# Patient Record
Sex: Male | Born: 1945
Health system: Southern US, Community
[De-identification: ages and names within clinical notes are randomized; demographics above are authoritative.]

## PROBLEM LIST (undated history)

## (undated) DIAGNOSIS — M549 Dorsalgia, unspecified: Secondary | ICD-10-CM

## (undated) HISTORY — PX: EAR MASTOIDECTOMY W/ COCHLEAR IMPLANT W/ LANDMARK: SHX1483

## (undated) HISTORY — PX: OTHER SURGICAL HISTORY: SHX169

## (undated) HISTORY — PX: BACK SURGERY: SHX140

## (undated) HISTORY — PX: INNER EAR SURGERY: SHX679

## (undated) HISTORY — PX: CATARACT EXTRACTION: SUR2

---

## 1997-12-27 ENCOUNTER — Emergency Department (HOSPITAL_COMMUNITY): Admission: EM | Admit: 1997-12-27 | Discharge: 1997-12-27 | Payer: Self-pay

## 2003-01-22 ENCOUNTER — Other Ambulatory Visit: Admission: RE | Admit: 2003-01-22 | Discharge: 2003-01-22 | Payer: Self-pay | Admitting: Family Medicine

## 2004-12-08 ENCOUNTER — Ambulatory Visit (HOSPITAL_COMMUNITY): Admission: RE | Admit: 2004-12-08 | Discharge: 2004-12-08 | Payer: Self-pay | Admitting: Family Medicine

## 2006-05-09 ENCOUNTER — Ambulatory Visit (HOSPITAL_COMMUNITY): Admission: RE | Admit: 2006-05-09 | Discharge: 2006-05-09 | Payer: Self-pay | Admitting: Family Medicine

## 2006-06-13 ENCOUNTER — Encounter (INDEPENDENT_AMBULATORY_CARE_PROVIDER_SITE_OTHER): Payer: Self-pay | Admitting: *Deleted

## 2006-06-13 ENCOUNTER — Ambulatory Visit (HOSPITAL_COMMUNITY): Admission: RE | Admit: 2006-06-13 | Discharge: 2006-06-13 | Payer: Self-pay | Admitting: General Surgery

## 2007-07-04 ENCOUNTER — Ambulatory Visit (HOSPITAL_COMMUNITY): Admission: RE | Admit: 2007-07-04 | Discharge: 2007-07-04 | Payer: Self-pay | Admitting: Family Medicine

## 2008-01-31 ENCOUNTER — Ambulatory Visit (HOSPITAL_COMMUNITY): Admission: RE | Admit: 2008-01-31 | Discharge: 2008-01-31 | Payer: Self-pay | Admitting: Family Medicine

## 2008-04-15 ENCOUNTER — Inpatient Hospital Stay (HOSPITAL_COMMUNITY): Admission: RE | Admit: 2008-04-15 | Discharge: 2008-04-18 | Payer: Self-pay | Admitting: Neurosurgery

## 2009-04-09 ENCOUNTER — Ambulatory Visit (HOSPITAL_COMMUNITY): Admission: RE | Admit: 2009-04-09 | Discharge: 2009-04-09 | Payer: Self-pay | Admitting: Family Medicine

## 2009-05-05 ENCOUNTER — Inpatient Hospital Stay (HOSPITAL_COMMUNITY): Admission: RE | Admit: 2009-05-05 | Discharge: 2009-05-07 | Payer: Self-pay | Admitting: Neurosurgery

## 2010-06-16 LAB — URINALYSIS, ROUTINE W REFLEX MICROSCOPIC
Bilirubin Urine: NEGATIVE
Glucose, UA: NEGATIVE mg/dL
Hgb urine dipstick: NEGATIVE
Ketones, ur: NEGATIVE mg/dL
Protein, ur: NEGATIVE mg/dL
Urobilinogen, UA: 0.2 mg/dL (ref 0.0–1.0)
pH: 5.5 (ref 5.0–8.0)

## 2010-06-16 LAB — CBC
Hemoglobin: 17.3 g/dL — ABNORMAL HIGH (ref 13.0–17.0)
MCHC: 34.8 g/dL (ref 30.0–36.0)
MCHC: 35.4 g/dL (ref 30.0–36.0)
MCV: 94.8 fL (ref 78.0–100.0)
MCV: 96.2 fL (ref 78.0–100.0)
WBC: 12.8 10*3/uL — ABNORMAL HIGH (ref 4.0–10.5)

## 2010-06-16 LAB — COMPREHENSIVE METABOLIC PANEL
AST: 25 U/L (ref 0–37)
Albumin: 4 g/dL (ref 3.5–5.2)
Alkaline Phosphatase: 77 U/L (ref 39–117)
CO2: 29 mEq/L (ref 19–32)
Calcium: 9.6 mg/dL (ref 8.4–10.5)
Chloride: 101 mEq/L (ref 96–112)
Glucose, Bld: 235 mg/dL — ABNORMAL HIGH (ref 70–99)
Potassium: 4.4 mEq/L (ref 3.5–5.1)
Sodium: 139 mEq/L (ref 135–145)
Total Protein: 7.1 g/dL (ref 6.0–8.3)

## 2010-06-16 LAB — DIFFERENTIAL
Basophils Relative: 1 % (ref 0–1)
Lymphocytes Relative: 19 % (ref 12–46)
Monocytes Relative: 5 % (ref 3–12)
Neutrophils Relative %: 74 % (ref 43–77)

## 2010-06-16 LAB — ABO/RH: ABO/RH(D): O NEG

## 2010-06-16 LAB — PROTIME-INR: INR: 1.06 (ref 0.00–1.49)

## 2010-06-16 LAB — TYPE AND SCREEN
ABO/RH(D): O NEG
Antibody Screen: NEGATIVE

## 2010-06-16 LAB — APTT: aPTT: 27 seconds (ref 24–37)

## 2010-06-16 LAB — MRSA PCR SCREENING: MRSA by PCR: NEGATIVE

## 2010-07-12 LAB — COMPREHENSIVE METABOLIC PANEL
ALT: 31 U/L (ref 0–53)
AST: 27 U/L (ref 0–37)
Alkaline Phosphatase: 78 U/L (ref 39–117)
BUN: 9 mg/dL (ref 6–23)
CO2: 28 mEq/L (ref 19–32)
Calcium: 9.4 mg/dL (ref 8.4–10.5)
Chloride: 104 mEq/L (ref 96–112)
GFR calc non Af Amer: >60 mL/min (ref >60)
Potassium: 4 mEq/L (ref 3.5–5.1)
Sodium: 139 mEq/L (ref 135–145)
Total Bilirubin: 0.5 mg/dL (ref 0.3–1.2)

## 2010-07-12 LAB — DIFFERENTIAL
Basophils Relative: 1 % (ref 0–1)
Eosinophils Absolute: 0.5 10*3/uL (ref 0.0–0.7)
Eosinophils Relative: 4 % (ref 0–5)
Lymphs Abs: 3.1 10*3/uL (ref 0.7–4.0)
Monocytes Absolute: 0.9 10*3/uL (ref 0.1–1.0)
Monocytes Relative: 8 % (ref 3–12)
Neutrophils Relative %: 60 % (ref 43–77)

## 2010-07-12 LAB — PROTIME-INR
INR: 1 (ref 0.00–1.49)
Prothrombin Time: 13.7 seconds (ref 11.6–15.2)

## 2010-07-12 LAB — CBC
HCT: 48.4 % (ref 39.0–52.0)
MCHC: 33.7 g/dL (ref 30.0–36.0)
MCV: 93.3 fL (ref 78.0–100.0)
RDW: 13 % (ref 11.5–15.5)
WBC: 11.3 10*3/uL — ABNORMAL HIGH (ref 4.0–10.5)

## 2010-07-12 LAB — URINALYSIS, ROUTINE W REFLEX MICROSCOPIC
Glucose, UA: NEGATIVE mg/dL
Nitrite: NEGATIVE
Protein, ur: NEGATIVE mg/dL
Urobilinogen, UA: 0.2 mg/dL (ref 0.0–1.0)

## 2010-08-10 NOTE — Discharge Summary (Signed)
Douglas Waller, Douglas Waller                 ACCOUNT NO.:  [id number]   MEDICAL RECORD NO.:  [id number]          PATIENT TYPE:  INP   LOCATION:  3013                         FACILITY:  [facility]   PHYSICIAN:  Payton Doughty, M.D.      DATE OF BIRTH:  06/09/1945   DATE OF ADMISSION:  04/15/2008  DATE OF DISCHARGE:  04/18/2008                               DISCHARGE SUMMARY   ADMITTING DIAGNOSIS:  Spondylosis at L2-L3, L3-L4, and L4-5, neurogenic  claudication.   DISCHARGE DIAGNOSIS:  Spondylosis at L2-L3, L3-L4, and L4-5, neurogenic  claudication.   OPERATIVE PROCEDURES:  L2-L3, L3-L4, and L4-L5 decompressive lumbar  laminectomy.   DICTATING DOCTOR:  Payton Doughty, MD.   COMPLICATIONS:  None.   DISCHARGE STATUS:  Alive and well.   BODY OF TEXT:  This is a 65 year old right-handed white gentleman.  History and physical is recounted in the chart.  He has had back pain  for numerous years.  He has developed claudicatory symptoms in the lower  extremities.  MRI showed spinal stenosis.  He was admitted for  laminectomy.   MEDICAL HISTORY:  Benign.   General exam is intact.   ALLERGIES:  PENICILLIN and CODEINE.   SURGICAL HISTORY:  Left hip prosthesis and cataract.   Neurologically, he was intact.  No claudicatory complaints.  He was  admitted after ascertaining normal drug values and underwent a  decompressive laminectomy.  Postoperatively, he has done well.  For a  couple of days, he was on PCA and had a Foley.  Foley was removed.  He  is up and about, eating.  He had trouble with his bowels, but now it is  working fine.  He has been discharged home in the care of his family.  His followup to be in the [facility] offices in about a week for sutures.           ______________________________  Payton Doughty, M.D.     MWR/MEDQ  D:  04/18/2008  T:  04/19/2008  Job:  [id number]

## 2010-08-10 NOTE — H&P (Signed)
NAMEELIZANDRO, LAURA                 ACCOUNT NO.:  [id number]   MEDICAL RECORD NO.:  [id number]          PATIENT TYPE:  INP   LOCATION:  3013                         FACILITY:  [facility]   PHYSICIAN:  Payton Doughty, M.D.      DATE OF BIRTH:  [birth year]   DATE OF ADMISSION:  04/15/2008  DATE OF DISCHARGE:                              HISTORY & PHYSICAL   BODY:  A 65 year old right-handed white gentleman had pain in his back  for lot of years.  He has been developing claudicatory complaints upper  buttocks and lower back.  MRI showed spinal stenosis.  He is now  admitted for laminectomy.   MEDICAL HISTORY:  Benign.  He has lost 50 pounds and has not had any  diabetes, takes Lipitor, aspirin, fish oil and oxycodone.   ALLERGIES:  He is allergic to PENICILLIN and CODEINE.   SURGICAL HISTORY:  Left hip prosthesis and a cataract operation.   SOCIAL HISTORY:  He smokes a pack of cigarettes a day.  Does not drink  alcohol.  He is a Visual merchandiser.   FAMILY HISTORY:  Parents are deceased, history is not given.   REVIEW OF SYSTEMS:  Remarkable for painful urination, difficulty  starting and stopping, leg weakness, and back pain.   PHYSICAL EXAMINATION:  HEENT:  Normal limits.  He has reasonable range  of motion of neck.  CHEST:  Clear.  CARDIAC:  Regular rate and rhythm.  ABDOMEN:  Nontender.  No hepatosplenomegaly.  EXTREMITIES:  Without clubbing or cyanosis.  Peripheral pulses are good.  GU:  Deferred.  NEUROLOGIC:  He is awake, alert, and oriented.  Cranial nerves are  intact.  Motor exam shows 5/5 strength throughout the upper and lower  extremities.  Sensory dysesthesias described in the left in an L4-L5  distribution.  He reflects it in lower extremities.   LABORATORY DATA:  MRI shows severe spinal stenosis at 2-3, 3-4 and 4-5.  5-1 then looks so bad.   CLINICAL IMPRESSION:  Neurogenic claudication secondary to spinal  stenosis.  Plan is to decompress with laminectomy 2-3, 3-4, and 4-5.  The risks and benefits have been discussed with him.  He wished to  proceed.           ______________________________  Payton Doughty, M.D.    MWR/MEDQ  D:  04/15/2008  T:  04/16/2008  Job:  [id number]

## 2010-08-10 NOTE — Op Note (Signed)
Douglas Waller, Douglas Waller                 ACCOUNT NO.:  [id number]   MEDICAL RECORD NO.:  [id number]          PATIENT TYPE:  INP   LOCATION:  3013                         FACILITY:  [facility]   PHYSICIAN:  Payton Doughty, M.D.      DATE OF BIRTH:  1945/11/22   DATE OF PROCEDURE:  04/15/2008  DATE OF DISCHARGE:                               OPERATIVE REPORT   PREOPERATIVE DIAGNOSIS:  Neurogenic claudication secondary to spinal  stenosis at L2-3, L3-4, and L4-5.   POSTOPERATIVE DIAGNOSIS:  Neurogenic claudication secondary to spinal  stenosis at L2-3, L3-4, and L4-5.   OPERATIVE PROCEDURE:  L2-3, L3-4, and L4-5 decompressive lumbar  laminectomy.   ANESTHESIA:  General endotracheal.   PREPARATION:  Prepped and draped with alcohol wipe.   COMPLICATIONS:  None.   NURSING ASSISTANT:  Bedelia Person, M.D.   DOCTOR ASSISTANT:  Cristi Loron, M.D.   BODY OF TEXT:  This 65 year old with severe neurogenic claudication,  taken to the operating room, smoothly anesthetized and intubated, placed  prone on the operating table.  Following shave, prep, and drape in the  usual sterile fashion, the skin was infiltrated with 1% lidocaine with  1:400,000 epinephrine.  The skin was incised from the top of L2 to the  bottom of L4.  The lamina of L2, L3, and L4 were dissected free in the  subperiosteal plane and intraoperative x-ray confirmed correctness of  level.  Having correctness of level, a total laminectomy of L2, L3, and  L4 was carried out using the large Leksell, small Leksell, and high-  speed drill and the Kerrison.  This allowed complete decompression and  removing the ligamentum flavum, it was tightest at L3-4.  The abundant,  redundant ligamentum flavum was removed and the foramen carefully  inspected and found to be open.  Wound was irrigated and hemostasis  assured.  Fat was placed in the laminectomy defect.  Successive layers  of 0 Vicryl, 2-0 Vicryl, and 3-0 nylon were used to close.  Betadine  and  Telfa dressing was applied and made occlusive with OpSite and the  patient returned to the recovery room in good condition.           ______________________________  Payton Doughty, M.D.     MWR/MEDQ  D:  04/15/2008  T:  04/16/2008  Job:  [id number]

## 2010-08-13 NOTE — H&P (Signed)
Douglas Waller, Douglas Waller                 ACCOUNT NO.:  [id number]   MEDICAL RECORD NO.:  [id number]          PATIENT TYPE:  AMB   LOCATION:                                FACILITY:  [facility]   PHYSICIAN:  Dalia Heading, M.D.  DATE OF BIRTH:  [birth year]   DATE OF ADMISSION:  06/13/2006  DATE OF DISCHARGE:  LH                              HISTORY & PHYSICAL   CHIEF COMPLAINT:  Need for screening colonoscopy.   HISTORY OF PRESENT ILLNESS:  The patient is a 65 year old white male who  is referred for endoscopic evaluation.  He needs colonoscopy for  screening purposes.  No abdominal pain, weight loss, nausea, vomiting,  diarrhea, constipation, melena, or hematochezia have been noted.  He has  never had a colonoscopy.  There is no family history of colon carcinoma.   PAST MEDICAL HISTORY:  Includes high cholesterol levels.   PAST SURGICAL HISTORY:  Unremarkable.   CURRENT MEDICATIONS:  Lipitor.   ALLERGIES:  No known drug allergies.   REVIEW OF SYSTEMS:  The patient smokes a pack of cigarettes a day.  Denies alcohol use.  He denies any other cardiopulmonary difficulties or  bleeding disorders.   PHYSICAL EXAMINATION:  GENERAL:  The patient is a well-developed, well-  nourished white male in no acute distress.  LUNGS:  Clear to auscultation. with good breath sounds bilaterally.  HEART:  Reveals a regular rate and rhythm, without S3, S4, or murmurs.  ABDOMEN:  Soft, nontender, nondistended.  No hepatosplenomegaly or  masses are noted.  RECTAL:  Deferred to the procedure.   IMPRESSION:  Need for screening colonoscopy.   PLAN:  The patient was scheduled for colonoscopy on June 13, 2006.  The  risks and benefits of the procedure, including bleeding and perforation,  were fully explained to the patient, who gave informed consent.      Dalia Heading, M.D.  Electronically Signed     MAJ/MEDQ  D:  05/30/2006  T:  05/30/2006  Job:  [id number]   cc:   Patrica Duel, M.D.  Fax:  [phone]

## 2011-04-04 DIAGNOSIS — Z6825 Body mass index (BMI) 25.0-25.9, adult: Secondary | ICD-10-CM | POA: Diagnosis not present

## 2011-04-04 DIAGNOSIS — G589 Mononeuropathy, unspecified: Secondary | ICD-10-CM | POA: Diagnosis not present

## 2011-04-04 DIAGNOSIS — E119 Type 2 diabetes mellitus without complications: Secondary | ICD-10-CM | POA: Diagnosis not present

## 2011-04-04 DIAGNOSIS — G8929 Other chronic pain: Secondary | ICD-10-CM | POA: Diagnosis not present

## 2011-04-26 DIAGNOSIS — Z0000 Encounter for general adult medical examination without abnormal findings: Secondary | ICD-10-CM | POA: Diagnosis not present

## 2011-04-26 DIAGNOSIS — Z125 Encounter for screening for malignant neoplasm of prostate: Secondary | ICD-10-CM | POA: Diagnosis not present

## 2011-04-26 DIAGNOSIS — E785 Hyperlipidemia, unspecified: Secondary | ICD-10-CM | POA: Diagnosis not present

## 2011-04-26 DIAGNOSIS — M549 Dorsalgia, unspecified: Secondary | ICD-10-CM | POA: Diagnosis not present

## 2011-04-26 DIAGNOSIS — Z6825 Body mass index (BMI) 25.0-25.9, adult: Secondary | ICD-10-CM | POA: Diagnosis not present

## 2011-04-26 DIAGNOSIS — E119 Type 2 diabetes mellitus without complications: Secondary | ICD-10-CM | POA: Diagnosis not present

## 2011-07-15 DIAGNOSIS — E119 Type 2 diabetes mellitus without complications: Secondary | ICD-10-CM | POA: Diagnosis not present

## 2011-07-15 DIAGNOSIS — G8929 Other chronic pain: Secondary | ICD-10-CM | POA: Diagnosis not present

## 2011-07-15 DIAGNOSIS — IMO0002 Reserved for concepts with insufficient information to code with codable children: Secondary | ICD-10-CM | POA: Diagnosis not present

## 2011-10-26 DIAGNOSIS — H0019 Chalazion unspecified eye, unspecified eyelid: Secondary | ICD-10-CM | POA: Diagnosis not present

## 2011-10-31 DIAGNOSIS — G8929 Other chronic pain: Secondary | ICD-10-CM | POA: Diagnosis not present

## 2011-10-31 DIAGNOSIS — Z79899 Other long term (current) drug therapy: Secondary | ICD-10-CM | POA: Diagnosis not present

## 2011-11-04 DIAGNOSIS — M545 Low back pain: Secondary | ICD-10-CM | POA: Diagnosis not present

## 2011-11-18 DIAGNOSIS — Z6825 Body mass index (BMI) 25.0-25.9, adult: Secondary | ICD-10-CM | POA: Diagnosis not present

## 2011-11-18 DIAGNOSIS — G8929 Other chronic pain: Secondary | ICD-10-CM | POA: Diagnosis not present

## 2011-12-07 DIAGNOSIS — K219 Gastro-esophageal reflux disease without esophagitis: Secondary | ICD-10-CM | POA: Diagnosis not present

## 2011-12-07 DIAGNOSIS — M549 Dorsalgia, unspecified: Secondary | ICD-10-CM | POA: Diagnosis not present

## 2011-12-07 DIAGNOSIS — Z23 Encounter for immunization: Secondary | ICD-10-CM | POA: Diagnosis not present

## 2011-12-07 DIAGNOSIS — Z6825 Body mass index (BMI) 25.0-25.9, adult: Secondary | ICD-10-CM | POA: Diagnosis not present

## 2011-12-15 ENCOUNTER — Emergency Department (HOSPITAL_COMMUNITY): Payer: Medicare Other

## 2011-12-15 ENCOUNTER — Emergency Department (HOSPITAL_COMMUNITY)
Admission: EM | Admit: 2011-12-15 | Discharge: 2011-12-15 | Disposition: A | Discharge status: Home / Self Care | Payer: Medicare Other | Attending: Emergency Medicine | Admitting: Emergency Medicine

## 2011-12-15 ENCOUNTER — Encounter (HOSPITAL_COMMUNITY): Payer: Self-pay | Admitting: *Deleted

## 2011-12-15 DIAGNOSIS — W268XXA Contact with other sharp object(s), not elsewhere classified, initial encounter: Secondary | ICD-10-CM | POA: Insufficient documentation

## 2011-12-15 DIAGNOSIS — F17200 Nicotine dependence, unspecified, uncomplicated: Secondary | ICD-10-CM | POA: Insufficient documentation

## 2011-12-15 DIAGNOSIS — M25549 Pain in joints of unspecified hand: Secondary | ICD-10-CM | POA: Diagnosis not present

## 2011-12-15 DIAGNOSIS — S61209A Unspecified open wound of unspecified finger without damage to nail, initial encounter: Secondary | ICD-10-CM | POA: Diagnosis not present

## 2011-12-15 DIAGNOSIS — IMO0002 Reserved for concepts with insufficient information to code with codable children: Secondary | ICD-10-CM | POA: Insufficient documentation

## 2011-12-15 DIAGNOSIS — S68118A Complete traumatic metacarpophalangeal amputation of other finger, initial encounter: Secondary | ICD-10-CM | POA: Diagnosis not present

## 2011-12-15 LAB — CBC WITH DIFFERENTIAL/PLATELET
Basophils Absolute: 0.1 10*3/uL (ref 0.0–0.1)
HCT: 47.2 % (ref 39.0–52.0)
Lymphocytes Relative: 27 % (ref 12–46)
Neutro Abs: 5.1 10*3/uL (ref 1.7–7.7)
Neutrophils Relative %: 61 % (ref 43–77)
Platelets: 187 10*3/uL (ref 150–400)
RDW: 13 % (ref 11.5–15.5)
WBC: 8.4 10*3/uL (ref 4.0–10.5)

## 2011-12-15 LAB — HEPATIC FUNCTION PANEL
ALT: 14 U/L (ref 0–53)
Albumin: 3.6 g/dL (ref 3.5–5.2)
Alkaline Phosphatase: 107 U/L (ref 39–117)
Total Protein: 7.7 g/dL (ref 6.0–8.3)

## 2011-12-15 LAB — BASIC METABOLIC PANEL
CO2: 29 mEq/L (ref 19–32)
Chloride: 96 mEq/L (ref 96–112)
GFR calc Af Amer: >90 mL/min (ref >90)
Potassium: 4.1 mEq/L (ref 3.5–5.1)
Sodium: 134 mEq/L — ABNORMAL LOW (ref 135–145)

## 2011-12-15 MED ORDER — LIDOCAINE HCL (PF) 1 % IJ SOLN
INTRAMUSCULAR | Status: AC
  Administered 2011-12-15: 10 mL
  Filled 2011-12-15: qty 20

## 2011-12-15 MED ORDER — OXYCODONE-ACETAMINOPHEN 5-325 MG PO TABS: 1.0000 | ORAL_TABLET | Freq: Four times a day (QID) | ORAL | Status: DC | PRN

## 2011-12-15 MED ORDER — CEPHALEXIN 500 MG PO CAPS: 500.0000 mg | ORAL_CAPSULE | Freq: Four times a day (QID) | ORAL | Status: DC

## 2011-12-15 MED ORDER — TETANUS-DIPHTH-ACELL PERTUSSIS 5-2.5-18.5 LF-MCG/0.5 IM SUSP
0.5000 mL | Freq: Once | INTRAMUSCULAR | Status: AC
  Administered 2011-12-15: 0.5 mL via INTRAMUSCULAR
  Filled 2011-12-15: qty 0.5

## 2011-12-15 MED ORDER — LIDOCAINE HCL (PF) 1 % IJ SOLN
INTRAMUSCULAR | Status: AC
  Filled 2011-12-15: qty 10

## 2011-12-15 MED ORDER — CEFAZOLIN SODIUM 1-5 GM-% IV SOLN
1.0000 g | Freq: Once | INTRAVENOUS | Status: AC
  Administered 2011-12-15: 1 g via INTRAVENOUS
  Filled 2011-12-15: qty 50

## 2011-12-15 NOTE — ED Notes (Addendum)
Lac to rt 5th finger , caught in a belt on motor at [employer] [place]  .  Tip of finger and pad avulsed .  Injury to ring  Finger also

## 2011-12-15 NOTE — ED Provider Notes (Signed)
History   This chart was scribed for Douglas B. Bernette Mayers, MD by Sofie Rower. The patient was seen in room APA06/APA06 and the patient's care was started at 2:28PM    CSN: [id number]  Arrival date & time 12/15/11  1406   First MD Initiated Contact with Patient 12/15/11 1428      Chief Complaint  Patient presents with  . Laceration    (Consider location/radiation/quality/duration/timing/severity/associated sxs/prior treatment) Patient is a 66 y.o. male presenting with skin laceration. The history is provided by the patient. No language interpreter was used.  Laceration     Douglas Waller is a 66 y.o. male , with a hx of back pain, who presents to the Emergency Department complaining of sudden, moderate, lcaeraton, located at the left 4th and 5th fingers on the right hand, onset today. The pt reports he placed his hand between the motor of his automobile and the fan belt, where he suddenly caught his hand on the fan belt, and cut his fingers. The pt is unaware of when his last tetanus shot was administered. Modifying factors include application of a cotton bandage which provides moderate pain relief of the laceration. The pt has a hx of back surgery, cataract extraction, and inner ear sugery.   The pt denies allergies to any medications.   The pt is a current everyday smoker, however, he does not drink alcohol.   PCP is Dr. Regino Schultze.    History reviewed. No pertinent past medical history.  Past Surgical History  Procedure Date  . Back surgery   . Cataract extraction   . Inner ear surgery     History reviewed. No pertinent family history.  History  Substance Use Topics  . Smoking status: Current Every Day Smoker  . Smokeless tobacco: Not on file  . Alcohol Use: No      Review of Systems  All other systems reviewed and are negative.    Allergies  Review of patient's allergies indicates no known allergies.  Home Medications  No current outpatient prescriptions on  file.  BP 136/58  Pulse 65  Temp 98.1 F (36.7 C) (Oral)  Resp 20  Ht 5\' 11"  (1.803 m)  Wt 192 lb (87.091 kg)  BMI 26.78 kg/m2  SpO2 98%  Physical Exam  Nursing note and vitals reviewed. Constitutional: He is oriented to person, place, and time. He appears well-developed and well-nourished.  HENT:  Head: Normocephalic and atraumatic.  Eyes: EOM are normal. Pupils are equal, round, and reactive to light.  Neck: Normal range of motion. Neck supple.  Cardiovascular: Normal rate, normal heart sounds and intact distal pulses.   Pulmonary/Chest: Effort normal and breath sounds normal.  Abdominal: Bowel sounds are normal. He exhibits no distension. There is no tenderness.  Musculoskeletal: Normal range of motion. He exhibits no edema and no tenderness.       Right 5th finger: avulsion/amputation of the pad, extending about half way through the nailbed. Right 4th finger: superficial skin tear proximal to the nailbed.   Neurological: He is alert and oriented to person, place, and time. He has normal strength. No cranial nerve deficit or sensory deficit.  Skin: Skin is warm and dry. No rash noted.  Psychiatric: He has a normal mood and affect.    ED Course  Procedures (including critical care time)  Digital Block Performed by: SHELDON,Douglas B. Consent: Verbal consent obtained.  Anesthesia: Digital block Local anesthetic: lidocaine 1% no epinephrine Anesthetic total: 4 ml Irrigation method: syringe Amount of  cleaning: betadine soaks Patient tolerance: Patient tolerated the procedure well with no immediate complications.   DIAGNOSTIC STUDIES: Oxygen Saturation is 98% on room air, normal by my interpretation.    COORDINATION OF CARE:    2:46PM- Treatment plan discussed with patient. Pt agrees with treatment.   2:47PM- Digital block performed  3:17PM- Recheck. X-ray results and treatment plan discussed with pt. Pt agrees to treatment.   Labs Reviewed - No data to  display Dg Hand Complete Right  12/15/2011  *RADIOLOGY REPORT*  Clinical Data: Laceration to the fourth and fifth digits.  Fifth digit amputation.  RIGHT HAND - COMPLETE 3+ VIEW  Comparison:  Findings: There is amputation of the distal aspect of the distal phalanx in the fifth digit.  A soft tissue amputation is present as well.  The joints are located.  There is irregularity in the tuft of the fourth digit.  Soft tissue swelling is present proximal to the nail bed.  There appears to be disruption of the nail bed as well.  No radiopaque foreign body is present.  The remainder of the hand is remarkable for degenerative change.  IMPRESSION:  1.  Amputation of the distal aspect the distal phalanx and soft tissues in the small finger. 2.  Irregularity of the tuft of the distal phalanx in the ring finger may represent a nondisplaced fracture.  This could be related to remote trauma. 3.  No radiopaque foreign body remains.   Original Report Authenticated By: Jamesetta Orleans. MATTERN, M.D.       No diagnosis found.    MDM  Discussed with Dr. Hilda Lias who will come into the ED to evaluate the patient. Ancef and labs ordered for possible OR revision      I personally performed the services described in the documentation, which were scribed in my presence. The recorded information has been reviewed and considered.    Douglas B. Bernette Mayers, MD 12/15/11 1527

## 2011-12-15 NOTE — Consult Note (Signed)
Reason for Consult:  Finger tip amputation Referring Physician: ER  Douglas Waller is an 65 y.o. male.  HPI: He was using a belt and cut the right hand little and ring fingers with amputation of the distal little finger.  He does not remember when he last had tetanus shot.  He has no other injury.  History reviewed. No pertinent past medical history.  Past Surgical History  Procedure Date  . Back surgery   . Cataract extraction   . Inner ear surgery     History reviewed. No pertinent family history.  Social History:  reports that he has been smoking.  He does not have any smokeless tobacco history on file. He reports that he does not drink alcohol or use illicit drugs.  Allergies:  Allergies  Allergen Reactions  . Codeine     Years ago, patient cannot remember reaction    Medications: I have reviewed the patient's current medications.  No results found for this or any previous visit (from the past 48 hour(s)).  Dg Hand Complete Right  12/15/2011  *RADIOLOGY REPORT*  Clinical Data: Laceration to the fourth and fifth digits.  Fifth digit amputation.  RIGHT HAND - COMPLETE 3+ VIEW  Comparison:  Findings: There is amputation of the distal aspect of the distal phalanx in the fifth digit.  A soft tissue amputation is present as well.  The joints are located.  There is irregularity in the tuft of the fourth digit.  Soft tissue swelling is present proximal to the nail bed.  There appears to be disruption of the nail bed as well.  No radiopaque foreign body is present.  The remainder of the hand is remarkable for degenerative change.  IMPRESSION:  1.  Amputation of the distal aspect the distal phalanx and soft tissues in the small finger. 2.  Irregularity of the tuft of the distal phalanx in the ring finger may represent a nondisplaced fracture.  This could be related to remote trauma. 3.  No radiopaque foreign body remains.   Original Report Authenticated By: Jamesetta Orleans. MATTERN, M.D.      Review of Systems  Musculoskeletal: Positive for joint pain (He cut the right dominant little finger and the right ring finger on a belt today.  He has partial amputation of the little finger radially.).  All other systems reviewed and are negative.   Blood pressure 136/58, pulse 65, temperature 98.1 F (36.7 C), temperature source Oral, resp. rate 20, height 5\' 11"  (1.803 m), weight 87.091 kg (192 lb), SpO2 98.00%. Physical Exam  Constitutional: He is oriented to person, place, and time. He appears well-developed and well-nourished.  HENT:  Head: Normocephalic.  Eyes: Conjunctivae normal and EOM are normal. Pupils are equal, round, and reactive to light.  Neck: Normal range of motion.  Cardiovascular: Normal rate, regular rhythm and intact distal pulses.   Respiratory: Effort normal and breath sounds normal.  GI: Soft. Bowel sounds are normal.  Musculoskeletal: He exhibits tenderness (Partial amputation of the distal phalanx of the little finger radially, oblique cut with bleeding. He has laceration of the tip of the ring finger on the right as well.).       Hands: Neurological: He is alert and oriented to person, place, and time. He has normal reflexes.  Skin: Skin is warm and dry.  Psychiatric: He has a normal mood and affect. His behavior is normal. Judgment and thought content normal.    Assessment/Plan: Amputation of the tip of the little finger  right dominant hand and laceration of the ring finger.  Douglas Waller 12/15/2011, 3:43 PM

## 2011-12-15 NOTE — Progress Notes (Signed)
I prepped and draped his right hand.  He had 1% Xylocaine block.  The finger tip of the right hand was cleansed.  He has coverage of the remaining distal tip of the distal phalanx.  I applied a sterile dressing and finger gauze.  I plan to see him in my office on Monday for dressing change and Scarlet Red dressing.  He is to return if any problem.  He will have a deformity of the finger but he can still flex the tip.  He is to return here if any problem.

## 2011-12-19 DIAGNOSIS — S61209A Unspecified open wound of unspecified finger without damage to nail, initial encounter: Secondary | ICD-10-CM | POA: Diagnosis not present

## 2011-12-22 DIAGNOSIS — S61209A Unspecified open wound of unspecified finger without damage to nail, initial encounter: Secondary | ICD-10-CM | POA: Diagnosis not present

## 2011-12-26 DIAGNOSIS — H26499 Other secondary cataract, unspecified eye: Secondary | ICD-10-CM | POA: Diagnosis not present

## 2012-03-26 DIAGNOSIS — G4700 Insomnia, unspecified: Secondary | ICD-10-CM | POA: Diagnosis not present

## 2012-03-26 DIAGNOSIS — E1129 Type 2 diabetes mellitus with other diabetic kidney complication: Secondary | ICD-10-CM | POA: Diagnosis not present

## 2012-03-26 DIAGNOSIS — R809 Proteinuria, unspecified: Secondary | ICD-10-CM | POA: Diagnosis not present

## 2012-03-26 DIAGNOSIS — Z6825 Body mass index (BMI) 25.0-25.9, adult: Secondary | ICD-10-CM | POA: Diagnosis not present

## 2012-06-14 DIAGNOSIS — H6980 Other specified disorders of Eustachian tube, unspecified ear: Secondary | ICD-10-CM | POA: Diagnosis not present

## 2012-06-14 DIAGNOSIS — H908 Mixed conductive and sensorineural hearing loss, unspecified: Secondary | ICD-10-CM | POA: Diagnosis not present

## 2012-06-14 DIAGNOSIS — H905 Unspecified sensorineural hearing loss: Secondary | ICD-10-CM | POA: Diagnosis not present

## 2012-06-15 ENCOUNTER — Other Ambulatory Visit (HOSPITAL_COMMUNITY): Payer: Self-pay | Admitting: Family Medicine

## 2012-06-15 DIAGNOSIS — Z6826 Body mass index (BMI) 26.0-26.9, adult: Secondary | ICD-10-CM | POA: Diagnosis not present

## 2012-06-15 DIAGNOSIS — K219 Gastro-esophageal reflux disease without esophagitis: Secondary | ICD-10-CM | POA: Diagnosis not present

## 2012-06-15 DIAGNOSIS — E1129 Type 2 diabetes mellitus with other diabetic kidney complication: Secondary | ICD-10-CM | POA: Diagnosis not present

## 2012-06-15 DIAGNOSIS — G8929 Other chronic pain: Secondary | ICD-10-CM | POA: Diagnosis not present

## 2012-06-15 DIAGNOSIS — Z139 Encounter for screening, unspecified: Secondary | ICD-10-CM

## 2012-06-18 ENCOUNTER — Ambulatory Visit (HOSPITAL_COMMUNITY)
Admission: RE | Admit: 2012-06-18 | Discharge: 2012-06-18 | Disposition: A | Discharge status: Home / Self Care | Payer: Medicare Other | Source: Ambulatory Visit | Attending: Family Medicine | Admitting: Family Medicine

## 2012-06-18 DIAGNOSIS — Z1389 Encounter for screening for other disorder: Secondary | ICD-10-CM | POA: Diagnosis not present

## 2012-06-18 DIAGNOSIS — Z139 Encounter for screening, unspecified: Secondary | ICD-10-CM

## 2012-06-18 DIAGNOSIS — Z136 Encounter for screening for cardiovascular disorders: Secondary | ICD-10-CM | POA: Diagnosis not present

## 2012-07-05 DIAGNOSIS — H6980 Other specified disorders of Eustachian tube, unspecified ear: Secondary | ICD-10-CM | POA: Diagnosis not present

## 2012-07-05 DIAGNOSIS — H908 Mixed conductive and sensorineural hearing loss, unspecified: Secondary | ICD-10-CM | POA: Diagnosis not present

## 2012-07-05 DIAGNOSIS — H905 Unspecified sensorineural hearing loss: Secondary | ICD-10-CM | POA: Diagnosis not present

## 2012-07-25 DIAGNOSIS — E1129 Type 2 diabetes mellitus with other diabetic kidney complication: Secondary | ICD-10-CM | POA: Diagnosis not present

## 2012-07-25 DIAGNOSIS — Z23 Encounter for immunization: Secondary | ICD-10-CM | POA: Diagnosis not present

## 2012-07-25 DIAGNOSIS — G4700 Insomnia, unspecified: Secondary | ICD-10-CM | POA: Diagnosis not present

## 2012-07-25 DIAGNOSIS — E785 Hyperlipidemia, unspecified: Secondary | ICD-10-CM | POA: Diagnosis not present

## 2012-07-25 DIAGNOSIS — Z125 Encounter for screening for malignant neoplasm of prostate: Secondary | ICD-10-CM | POA: Diagnosis not present

## 2012-07-25 DIAGNOSIS — IMO0002 Reserved for concepts with insufficient information to code with codable children: Secondary | ICD-10-CM | POA: Diagnosis not present

## 2012-07-25 DIAGNOSIS — G8929 Other chronic pain: Secondary | ICD-10-CM | POA: Diagnosis not present

## 2012-08-24 DIAGNOSIS — Z23 Encounter for immunization: Secondary | ICD-10-CM | POA: Diagnosis not present

## 2012-12-07 DIAGNOSIS — Z6825 Body mass index (BMI) 25.0-25.9, adult: Secondary | ICD-10-CM | POA: Diagnosis not present

## 2012-12-07 DIAGNOSIS — N390 Urinary tract infection, site not specified: Secondary | ICD-10-CM | POA: Diagnosis not present

## 2013-01-17 DIAGNOSIS — H26499 Other secondary cataract, unspecified eye: Secondary | ICD-10-CM | POA: Diagnosis not present

## 2013-01-30 DIAGNOSIS — G4700 Insomnia, unspecified: Secondary | ICD-10-CM | POA: Diagnosis not present

## 2013-01-30 DIAGNOSIS — Z23 Encounter for immunization: Secondary | ICD-10-CM | POA: Diagnosis not present

## 2013-01-30 DIAGNOSIS — IMO0002 Reserved for concepts with insufficient information to code with codable children: Secondary | ICD-10-CM | POA: Diagnosis not present

## 2013-01-30 DIAGNOSIS — G8929 Other chronic pain: Secondary | ICD-10-CM | POA: Diagnosis not present

## 2013-04-12 DIAGNOSIS — E1129 Type 2 diabetes mellitus with other diabetic kidney complication: Secondary | ICD-10-CM | POA: Diagnosis not present

## 2013-04-12 DIAGNOSIS — G4700 Insomnia, unspecified: Secondary | ICD-10-CM | POA: Diagnosis not present

## 2013-04-12 DIAGNOSIS — Z6825 Body mass index (BMI) 25.0-25.9, adult: Secondary | ICD-10-CM | POA: Diagnosis not present

## 2013-04-12 DIAGNOSIS — H6690 Otitis media, unspecified, unspecified ear: Secondary | ICD-10-CM | POA: Diagnosis not present

## 2013-04-12 DIAGNOSIS — L259 Unspecified contact dermatitis, unspecified cause: Secondary | ICD-10-CM | POA: Diagnosis not present

## 2013-04-12 DIAGNOSIS — Z0000 Encounter for general adult medical examination without abnormal findings: Secondary | ICD-10-CM | POA: Diagnosis not present

## 2013-04-12 DIAGNOSIS — Z125 Encounter for screening for malignant neoplasm of prostate: Secondary | ICD-10-CM | POA: Diagnosis not present

## 2013-04-12 DIAGNOSIS — Z79899 Other long term (current) drug therapy: Secondary | ICD-10-CM | POA: Diagnosis not present

## 2013-08-27 DIAGNOSIS — G4700 Insomnia, unspecified: Secondary | ICD-10-CM | POA: Diagnosis not present

## 2013-08-27 DIAGNOSIS — Z23 Encounter for immunization: Secondary | ICD-10-CM | POA: Diagnosis not present

## 2013-08-27 DIAGNOSIS — R5383 Other fatigue: Secondary | ICD-10-CM | POA: Diagnosis not present

## 2013-08-27 DIAGNOSIS — G8929 Other chronic pain: Secondary | ICD-10-CM | POA: Diagnosis not present

## 2013-08-27 DIAGNOSIS — Z6825 Body mass index (BMI) 25.0-25.9, adult: Secondary | ICD-10-CM | POA: Diagnosis not present

## 2013-08-27 DIAGNOSIS — R5381 Other malaise: Secondary | ICD-10-CM | POA: Diagnosis not present

## 2013-11-22 DIAGNOSIS — E119 Type 2 diabetes mellitus without complications: Secondary | ICD-10-CM | POA: Diagnosis not present

## 2013-11-22 DIAGNOSIS — Z6825 Body mass index (BMI) 25.0-25.9, adult: Secondary | ICD-10-CM | POA: Diagnosis not present

## 2013-11-22 DIAGNOSIS — G4700 Insomnia, unspecified: Secondary | ICD-10-CM | POA: Diagnosis not present

## 2013-11-22 DIAGNOSIS — G8929 Other chronic pain: Secondary | ICD-10-CM | POA: Diagnosis not present

## 2014-01-23 DIAGNOSIS — H26493 Other secondary cataract, bilateral: Secondary | ICD-10-CM | POA: Diagnosis not present

## 2014-02-01 DIAGNOSIS — Z23 Encounter for immunization: Secondary | ICD-10-CM | POA: Diagnosis not present

## 2014-03-27 DIAGNOSIS — K219 Gastro-esophageal reflux disease without esophagitis: Secondary | ICD-10-CM | POA: Diagnosis not present

## 2014-03-27 DIAGNOSIS — Z6825 Body mass index (BMI) 25.0-25.9, adult: Secondary | ICD-10-CM | POA: Diagnosis not present

## 2014-03-27 DIAGNOSIS — E119 Type 2 diabetes mellitus without complications: Secondary | ICD-10-CM | POA: Diagnosis not present

## 2014-03-27 DIAGNOSIS — G894 Chronic pain syndrome: Secondary | ICD-10-CM | POA: Diagnosis not present

## 2014-06-20 DIAGNOSIS — E782 Mixed hyperlipidemia: Secondary | ICD-10-CM | POA: Diagnosis not present

## 2014-06-20 DIAGNOSIS — G4700 Insomnia, unspecified: Secondary | ICD-10-CM | POA: Diagnosis not present

## 2014-06-20 DIAGNOSIS — R5383 Other fatigue: Secondary | ICD-10-CM | POA: Diagnosis not present

## 2014-06-20 DIAGNOSIS — Z6825 Body mass index (BMI) 25.0-25.9, adult: Secondary | ICD-10-CM | POA: Diagnosis not present

## 2014-06-20 DIAGNOSIS — N400 Enlarged prostate without lower urinary tract symptoms: Secondary | ICD-10-CM | POA: Diagnosis not present

## 2014-06-20 DIAGNOSIS — E119 Type 2 diabetes mellitus without complications: Secondary | ICD-10-CM | POA: Diagnosis not present

## 2014-06-20 DIAGNOSIS — J0100 Acute maxillary sinusitis, unspecified: Secondary | ICD-10-CM | POA: Diagnosis not present

## 2014-06-20 DIAGNOSIS — G894 Chronic pain syndrome: Secondary | ICD-10-CM | POA: Diagnosis not present

## 2014-06-20 DIAGNOSIS — E663 Overweight: Secondary | ICD-10-CM | POA: Diagnosis not present

## 2014-06-30 DIAGNOSIS — H6504 Acute serous otitis media, recurrent, right ear: Secondary | ICD-10-CM | POA: Diagnosis not present

## 2014-06-30 DIAGNOSIS — H906 Mixed conductive and sensorineural hearing loss, bilateral: Secondary | ICD-10-CM | POA: Diagnosis not present

## 2014-06-30 DIAGNOSIS — J069 Acute upper respiratory infection, unspecified: Secondary | ICD-10-CM | POA: Diagnosis not present

## 2014-07-24 DIAGNOSIS — H6983 Other specified disorders of Eustachian tube, bilateral: Secondary | ICD-10-CM | POA: Diagnosis not present

## 2014-07-24 DIAGNOSIS — H906 Mixed conductive and sensorineural hearing loss, bilateral: Secondary | ICD-10-CM | POA: Diagnosis not present

## 2014-09-11 DIAGNOSIS — H524 Presbyopia: Secondary | ICD-10-CM | POA: Diagnosis not present

## 2014-09-11 DIAGNOSIS — H26493 Other secondary cataract, bilateral: Secondary | ICD-10-CM | POA: Diagnosis not present

## 2014-10-06 DIAGNOSIS — N400 Enlarged prostate without lower urinary tract symptoms: Secondary | ICD-10-CM | POA: Diagnosis not present

## 2014-10-06 DIAGNOSIS — E119 Type 2 diabetes mellitus without complications: Secondary | ICD-10-CM | POA: Diagnosis not present

## 2014-10-06 DIAGNOSIS — Z719 Counseling, unspecified: Secondary | ICD-10-CM | POA: Diagnosis not present

## 2014-10-06 DIAGNOSIS — Z1389 Encounter for screening for other disorder: Secondary | ICD-10-CM | POA: Diagnosis not present

## 2014-10-06 DIAGNOSIS — E663 Overweight: Secondary | ICD-10-CM | POA: Diagnosis not present

## 2014-10-06 DIAGNOSIS — Z6826 Body mass index (BMI) 26.0-26.9, adult: Secondary | ICD-10-CM | POA: Diagnosis not present

## 2014-10-06 DIAGNOSIS — K219 Gastro-esophageal reflux disease without esophagitis: Secondary | ICD-10-CM | POA: Diagnosis not present

## 2014-10-13 DIAGNOSIS — R5383 Other fatigue: Secondary | ICD-10-CM | POA: Diagnosis not present

## 2014-10-13 DIAGNOSIS — E119 Type 2 diabetes mellitus without complications: Secondary | ICD-10-CM | POA: Diagnosis not present

## 2014-10-13 DIAGNOSIS — N400 Enlarged prostate without lower urinary tract symptoms: Secondary | ICD-10-CM | POA: Diagnosis not present

## 2014-10-13 DIAGNOSIS — E782 Mixed hyperlipidemia: Secondary | ICD-10-CM | POA: Diagnosis not present

## 2014-12-31 DIAGNOSIS — E119 Type 2 diabetes mellitus without complications: Secondary | ICD-10-CM | POA: Diagnosis not present

## 2014-12-31 DIAGNOSIS — T887XXA Unspecified adverse effect of drug or medicament, initial encounter: Secondary | ICD-10-CM | POA: Diagnosis not present

## 2014-12-31 DIAGNOSIS — Z6825 Body mass index (BMI) 25.0-25.9, adult: Secondary | ICD-10-CM | POA: Diagnosis not present

## 2014-12-31 DIAGNOSIS — G894 Chronic pain syndrome: Secondary | ICD-10-CM | POA: Diagnosis not present

## 2014-12-31 DIAGNOSIS — Z23 Encounter for immunization: Secondary | ICD-10-CM | POA: Diagnosis not present

## 2014-12-31 DIAGNOSIS — K219 Gastro-esophageal reflux disease without esophagitis: Secondary | ICD-10-CM | POA: Diagnosis not present

## 2014-12-31 DIAGNOSIS — Z1389 Encounter for screening for other disorder: Secondary | ICD-10-CM | POA: Diagnosis not present

## 2014-12-31 DIAGNOSIS — N400 Enlarged prostate without lower urinary tract symptoms: Secondary | ICD-10-CM | POA: Diagnosis not present

## 2014-12-31 DIAGNOSIS — E663 Overweight: Secondary | ICD-10-CM | POA: Diagnosis not present

## 2015-01-16 DIAGNOSIS — J069 Acute upper respiratory infection, unspecified: Secondary | ICD-10-CM | POA: Diagnosis not present

## 2015-01-16 DIAGNOSIS — Z6825 Body mass index (BMI) 25.0-25.9, adult: Secondary | ICD-10-CM | POA: Diagnosis not present

## 2015-01-16 DIAGNOSIS — Z1389 Encounter for screening for other disorder: Secondary | ICD-10-CM | POA: Diagnosis not present

## 2015-01-16 DIAGNOSIS — E663 Overweight: Secondary | ICD-10-CM | POA: Diagnosis not present

## 2015-01-16 DIAGNOSIS — J209 Acute bronchitis, unspecified: Secondary | ICD-10-CM | POA: Diagnosis not present

## 2015-02-02 DIAGNOSIS — H6504 Acute serous otitis media, recurrent, right ear: Secondary | ICD-10-CM | POA: Diagnosis not present

## 2015-02-02 DIAGNOSIS — H6983 Other specified disorders of Eustachian tube, bilateral: Secondary | ICD-10-CM | POA: Diagnosis not present

## 2015-02-02 DIAGNOSIS — H906 Mixed conductive and sensorineural hearing loss, bilateral: Secondary | ICD-10-CM | POA: Diagnosis not present

## 2015-02-02 DIAGNOSIS — J069 Acute upper respiratory infection, unspecified: Secondary | ICD-10-CM | POA: Diagnosis not present

## 2015-02-25 DIAGNOSIS — H6531 Chronic mucoid otitis media, right ear: Secondary | ICD-10-CM | POA: Diagnosis not present

## 2015-02-25 DIAGNOSIS — H906 Mixed conductive and sensorineural hearing loss, bilateral: Secondary | ICD-10-CM | POA: Diagnosis not present

## 2015-02-25 DIAGNOSIS — H6983 Other specified disorders of Eustachian tube, bilateral: Secondary | ICD-10-CM | POA: Diagnosis not present

## 2015-03-27 DIAGNOSIS — H6523 Chronic serous otitis media, bilateral: Secondary | ICD-10-CM | POA: Diagnosis not present

## 2015-03-27 DIAGNOSIS — H6531 Chronic mucoid otitis media, right ear: Secondary | ICD-10-CM | POA: Diagnosis not present

## 2015-03-27 DIAGNOSIS — H906 Mixed conductive and sensorineural hearing loss, bilateral: Secondary | ICD-10-CM | POA: Diagnosis not present

## 2015-03-27 DIAGNOSIS — H6983 Other specified disorders of Eustachian tube, bilateral: Secondary | ICD-10-CM | POA: Diagnosis not present

## 2015-04-08 DIAGNOSIS — E663 Overweight: Secondary | ICD-10-CM | POA: Diagnosis not present

## 2015-04-08 DIAGNOSIS — G894 Chronic pain syndrome: Secondary | ICD-10-CM | POA: Diagnosis not present

## 2015-04-08 DIAGNOSIS — H6693 Otitis media, unspecified, bilateral: Secondary | ICD-10-CM | POA: Diagnosis not present

## 2015-04-08 DIAGNOSIS — R5383 Other fatigue: Secondary | ICD-10-CM | POA: Diagnosis not present

## 2015-04-08 DIAGNOSIS — Z1389 Encounter for screening for other disorder: Secondary | ICD-10-CM | POA: Diagnosis not present

## 2015-04-08 DIAGNOSIS — K219 Gastro-esophageal reflux disease without esophagitis: Secondary | ICD-10-CM | POA: Diagnosis not present

## 2015-04-08 DIAGNOSIS — E782 Mixed hyperlipidemia: Secondary | ICD-10-CM | POA: Diagnosis not present

## 2015-04-08 DIAGNOSIS — Z6825 Body mass index (BMI) 25.0-25.9, adult: Secondary | ICD-10-CM | POA: Diagnosis not present

## 2015-05-12 DIAGNOSIS — H906 Mixed conductive and sensorineural hearing loss, bilateral: Secondary | ICD-10-CM | POA: Diagnosis not present

## 2015-05-12 DIAGNOSIS — H6983 Other specified disorders of Eustachian tube, bilateral: Secondary | ICD-10-CM | POA: Diagnosis not present

## 2015-06-03 DIAGNOSIS — G894 Chronic pain syndrome: Secondary | ICD-10-CM | POA: Diagnosis not present

## 2015-06-03 DIAGNOSIS — E119 Type 2 diabetes mellitus without complications: Secondary | ICD-10-CM | POA: Diagnosis not present

## 2015-06-03 DIAGNOSIS — L02212 Cutaneous abscess of back [any part, except buttock]: Secondary | ICD-10-CM | POA: Diagnosis not present

## 2015-06-03 DIAGNOSIS — Z6825 Body mass index (BMI) 25.0-25.9, adult: Secondary | ICD-10-CM | POA: Diagnosis not present

## 2015-07-07 DIAGNOSIS — Z1389 Encounter for screening for other disorder: Secondary | ICD-10-CM | POA: Diagnosis not present

## 2015-07-07 DIAGNOSIS — E663 Overweight: Secondary | ICD-10-CM | POA: Diagnosis not present

## 2015-07-07 DIAGNOSIS — K219 Gastro-esophageal reflux disease without esophagitis: Secondary | ICD-10-CM | POA: Diagnosis not present

## 2015-07-07 DIAGNOSIS — Z6825 Body mass index (BMI) 25.0-25.9, adult: Secondary | ICD-10-CM | POA: Diagnosis not present

## 2015-07-07 DIAGNOSIS — L723 Sebaceous cyst: Secondary | ICD-10-CM | POA: Diagnosis not present

## 2015-07-07 DIAGNOSIS — N400 Enlarged prostate without lower urinary tract symptoms: Secondary | ICD-10-CM | POA: Diagnosis not present

## 2015-07-07 DIAGNOSIS — E1129 Type 2 diabetes mellitus with other diabetic kidney complication: Secondary | ICD-10-CM | POA: Diagnosis not present

## 2015-07-07 DIAGNOSIS — E119 Type 2 diabetes mellitus without complications: Secondary | ICD-10-CM | POA: Diagnosis not present

## 2015-08-14 DIAGNOSIS — Z0000 Encounter for general adult medical examination without abnormal findings: Secondary | ICD-10-CM | POA: Diagnosis not present

## 2015-08-14 DIAGNOSIS — Z1389 Encounter for screening for other disorder: Secondary | ICD-10-CM | POA: Diagnosis not present

## 2015-08-14 DIAGNOSIS — Z6824 Body mass index (BMI) 24.0-24.9, adult: Secondary | ICD-10-CM | POA: Diagnosis not present

## 2015-08-14 DIAGNOSIS — G894 Chronic pain syndrome: Secondary | ICD-10-CM | POA: Diagnosis not present

## 2015-09-10 DIAGNOSIS — H524 Presbyopia: Secondary | ICD-10-CM | POA: Diagnosis not present

## 2015-09-10 DIAGNOSIS — H26493 Other secondary cataract, bilateral: Secondary | ICD-10-CM | POA: Diagnosis not present

## 2015-11-11 DIAGNOSIS — H6983 Other specified disorders of Eustachian tube, bilateral: Secondary | ICD-10-CM | POA: Diagnosis not present

## 2015-11-11 DIAGNOSIS — H906 Mixed conductive and sensorineural hearing loss, bilateral: Secondary | ICD-10-CM | POA: Diagnosis not present

## 2015-11-11 DIAGNOSIS — H60332 Swimmer's ear, left ear: Secondary | ICD-10-CM | POA: Diagnosis not present

## 2015-11-11 DIAGNOSIS — H6532 Chronic mucoid otitis media, left ear: Secondary | ICD-10-CM | POA: Diagnosis not present

## 2015-11-11 DIAGNOSIS — H66002 Acute suppurative otitis media without spontaneous rupture of ear drum, left ear: Secondary | ICD-10-CM | POA: Diagnosis not present

## 2015-11-11 DIAGNOSIS — L03211 Cellulitis of face: Secondary | ICD-10-CM | POA: Diagnosis not present

## 2015-11-16 DIAGNOSIS — H6983 Other specified disorders of Eustachian tube, bilateral: Secondary | ICD-10-CM | POA: Diagnosis not present

## 2015-11-16 DIAGNOSIS — L03211 Cellulitis of face: Secondary | ICD-10-CM | POA: Diagnosis not present

## 2015-11-16 DIAGNOSIS — H66002 Acute suppurative otitis media without spontaneous rupture of ear drum, left ear: Secondary | ICD-10-CM | POA: Diagnosis not present

## 2015-11-16 DIAGNOSIS — H906 Mixed conductive and sensorineural hearing loss, bilateral: Secondary | ICD-10-CM | POA: Diagnosis not present

## 2015-11-16 DIAGNOSIS — H60332 Swimmer's ear, left ear: Secondary | ICD-10-CM | POA: Diagnosis not present

## 2016-01-20 DIAGNOSIS — E119 Type 2 diabetes mellitus without complications: Secondary | ICD-10-CM | POA: Diagnosis not present

## 2016-01-20 DIAGNOSIS — N400 Enlarged prostate without lower urinary tract symptoms: Secondary | ICD-10-CM | POA: Diagnosis not present

## 2016-01-20 DIAGNOSIS — Z1389 Encounter for screening for other disorder: Secondary | ICD-10-CM | POA: Diagnosis not present

## 2016-01-20 DIAGNOSIS — Z6825 Body mass index (BMI) 25.0-25.9, adult: Secondary | ICD-10-CM | POA: Diagnosis not present

## 2016-01-20 DIAGNOSIS — G894 Chronic pain syndrome: Secondary | ICD-10-CM | POA: Diagnosis not present

## 2016-02-11 DIAGNOSIS — Z1211 Encounter for screening for malignant neoplasm of colon: Secondary | ICD-10-CM | POA: Diagnosis not present

## 2016-02-11 NOTE — H&P (Signed)
  NTS SOAP Note  Vital Signs:  Vitals as of: 02/11/2016: Systolic 148: Diastolic 70: Heart Rate 71: Temp 98.85F (Temporal): Height 485ft 10in: Weight 191Lbs 0 Ounces: BMI 27.41   BMI : 27.41 kg/m2  Subjective: This 70 year old male presents for of need for screening TCS.  Last had a TCS over ten years ago.  No family h/o colon cancer.  Denies any blood per rectum, abdominal pain, constipation, diarrhea, weight loss.  Review of Symptoms:  Constitutional:negative Head:negative Eyes:negative Nose/Mouth/Throat:negative Cardiovascular:negative Respiratory:negative Gastrointestinnegative Genitourinary:negative joint, neck, and back pain Skin:negative Hematolgic/Lymphatic:negative Allergic/Immunologic:negative   Past Medical History:Reviewed  Past Medical History  Surgical History: PE tubes in remote past, back surgery Medical Problems: BPH Allergies: nkda Medications: flomax, oxycodone   Social History:Reviewed  Social History  Preferred Language: English Race:  White Ethnicity: Not Hispanic / Latino Age: [age] year Marital Status:  M Alcohol: no   Smoking Status: Current every day smoker reviewed on 02/11/2016 Started Date:  Packs per week:  Functional Status reviewed on 02/11/2016 ------------------------------------------------ Bathing: Normal Cooking: Normal Dressing: Normal Driving: Normal Eating: Normal Managing Meds: Normal Oral Care: Normal Shopping: Normal Toileting: Normal Transferring: Normal Walking: Normal Cognitive Status reviewed on 02/11/2016 ------------------------------------------------ Attention: Normal Decision Making: Normal Language: Normal Memory: Normal Motor: Normal Perception: Normal Problem Solving: Normal Visual and Spatial: Normal   Family History:Reviewed  Family Health History Mother, Deceased; Healthy;  Father, Deceased; Healthy;     Objective Information: General:Well appearing, well  nourished in no distress. Skin:no rash or prominent lesions Head:Atraumatic; no masses; no abnormalities Neck:Supple without lymphadenopathy.  Heart:RRR, no murmur or gallop.  Normal S1, S2.  No S3, S4.  Mild end expiratory wheezing noted bilaterally.  Equal bs bilaterally.  No rales, rhonchi. Abdomen:Soft, NT/ND, normal bowel sounds, no HSM, no masses.  No peritoneal signs. deferred to procedure Dr. Lamar BlinksGolding's notes reviewed. Assessment:Need for screening TCS  Diagnoses: V76.51  Z12.11 Screening for malignant neoplasm of colon (Encounter for screening for malignant neoplasm of colon)  Procedures: [id number] - OFFICE OUTPATIENT NEW 30 MINUTES    Plan:  Scheduled for screeing TCS on 03/08/16.  Trilyte prescribed.   Patient Education:Alternative treatments to surgery were discussed with patient (and family).Risks and benefits  of procedure including bleeding and perforation were fully explained to the patient (and family) who gave informed consent. Patient/family questions were addressed.  Follow-up:Pending Surgery

## 2016-03-08 ENCOUNTER — Ambulatory Visit (HOSPITAL_COMMUNITY)
Admission: RE | Admit: 2016-03-08 | Discharge: 2016-03-08 | Disposition: A | Discharge status: Home / Self Care | Payer: Medicare Other | Source: Ambulatory Visit | Attending: General Surgery | Admitting: General Surgery

## 2016-03-08 ENCOUNTER — Encounter (HOSPITAL_COMMUNITY)
Admission: RE | Disposition: A | Discharge status: Home / Self Care | Payer: Self-pay | Source: Ambulatory Visit | Attending: General Surgery

## 2016-03-08 ENCOUNTER — Encounter (HOSPITAL_COMMUNITY): Payer: Self-pay | Admitting: *Deleted

## 2016-03-08 DIAGNOSIS — N400 Enlarged prostate without lower urinary tract symptoms: Secondary | ICD-10-CM | POA: Diagnosis not present

## 2016-03-08 DIAGNOSIS — F17200 Nicotine dependence, unspecified, uncomplicated: Secondary | ICD-10-CM | POA: Diagnosis not present

## 2016-03-08 DIAGNOSIS — Z79899 Other long term (current) drug therapy: Secondary | ICD-10-CM | POA: Diagnosis not present

## 2016-03-08 DIAGNOSIS — Z1211 Encounter for screening for malignant neoplasm of colon: Secondary | ICD-10-CM | POA: Diagnosis not present

## 2016-03-08 HISTORY — DX: Dorsalgia, unspecified: M54.9

## 2016-03-08 HISTORY — PX: COLONOSCOPY: SHX5424

## 2016-03-08 SURGERY — COLONOSCOPY
Anesthesia: Moderate Sedation

## 2016-03-08 MED ORDER — MIDAZOLAM HCL 5 MG/5ML IJ SOLN
INTRAMUSCULAR | Status: AC
  Filled 2016-03-08: qty 10

## 2016-03-08 MED ORDER — MIDAZOLAM HCL 5 MG/5ML IJ SOLN
INTRAMUSCULAR | Status: DC | PRN
Start: 2016-03-08 — End: 2016-03-08
  Administered 2016-03-08: 3 mg via INTRAVENOUS

## 2016-03-08 MED ORDER — MEPERIDINE HCL 50 MG/ML IJ SOLN
INTRAMUSCULAR | Status: DC | PRN
  Administered 2016-03-08: 50 mg via INTRAVENOUS

## 2016-03-08 MED ORDER — SIMETHICONE 40 MG/0.6ML PO SUSP
ORAL | Status: AC
  Filled 2016-03-08: qty 0.6

## 2016-03-08 MED ORDER — SODIUM CHLORIDE 0.9 % IV SOLN: INTRAVENOUS | Status: DC

## 2016-03-08 MED ORDER — MEPERIDINE HCL 50 MG/ML IJ SOLN
INTRAMUSCULAR | Status: DC
Start: 2016-03-08 — End: 2016-03-08
  Filled 2016-03-08: qty 1

## 2016-03-08 NOTE — Op Note (Signed)
[facility]: [facility] Patient Name: Douglas PellantCecil Bonini Procedure Date: 03/08/2016 7:03 AM MRN: [id number] Date of Birth: 09/16/1945 Attending MD: Franky MachoMark Alana Dayton , MD CSN: [id number] Age: [age] Admit Type: Outpatient Procedure:                Colonoscopy Indications:              Screening for colorectal malignant neoplasm Providers:                Franky MachoMark Denim Start, MD, Nena PolioLisa Moore, RN, Toniann FailBonnie Pritchett                            RN, RN Referring MD:              Medicines:                Midazolam 3 mg IV, Meperidine 50 mg IV Complications:            No immediate complications. Estimated Blood Loss:     Estimated blood loss: none. Procedure:                Pre-Anesthesia Assessment:                           - Prior to the procedure, a History and Physical                            was performed, and patient medications and                            allergies were reviewed. The patient is competent.                            The risks and benefits of the procedure and the                            sedation options and risks were discussed with the                            patient. All questions were answered and informed                            consent was obtained. Patient identification and                            proposed procedure were verified by the physician                            and the nurse in the pre-procedure area in the                            procedure room. Mental Status Examination: alert                            and oriented. Airway Examination: normal  oropharyngeal airway and neck mobility. Respiratory                            Examination: clear to auscultation. CV Examination:                            RRR, no murmurs, no S3 or S4. Prophylactic                            Antibiotics: The patient does not require                            prophylactic antibiotics. Prior Anticoagulants: The                            patient has taken  no previous anticoagulant or                            antiplatelet agents. ASA Grade Assessment: II - A                            patient with mild systemic disease. After reviewing                            the risks and benefits, the patient was deemed in                            satisfactory condition to undergo the procedure.                            The anesthesia plan was to use moderate sedation /                            analgesia (conscious sedation). Immediately prior                            to administration of medications, the patient was                            re-assessed for adequacy to receive sedatives. The                            heart rate, respiratory rate, oxygen saturations,                            blood pressure, adequacy of pulmonary ventilation,                            and response to care were monitored throughout the                            procedure. The physical status of the patient was  re-assessed after the procedure.                           After obtaining informed consent, the colonoscope                            was passed under direct vision. Throughout the                            procedure, the patient's blood pressure, pulse, and                            oxygen saturations were monitored continuously. The                            Colonoscope was introduced through the anus and                            advanced to the the cecum, identified by the                            appendiceal orifice, ileocecal valve and palpation.                            No anatomical landmarks were photographed. The                            colonoscopy was performed without difficulty. The                            patient tolerated the procedure well. The quality                            of the bowel preparation was adequate. The total                            duration of the procedure was 10  minutes. Scope In: 7:32:24 AM Scope Out: 7:40:26 AM Scope Withdrawal Time: 0 hours 4 minutes 4 seconds  Total Procedure Duration: 0 hours 8 minutes 2 seconds  Findings:      The entire examined colon appeared normal on direct and retroflexion       views. Impression:               - The entire examined colon is normal on direct and                            retroflexion views.                           - No specimens collected. Moderate Sedation:      Moderate (conscious) sedation was administered by the endoscopy nurse       and supervised by the endoscopist. The following parameters were       monitored: oxygen saturation, heart rate, blood pressure, and response       to care. Total physician  intraservice time was 10 minutes. Recommendation:           - Patient has a contact number available for                            emergencies. The signs and symptoms of potential                            delayed complications were discussed with the                            patient. Return to normal activities tomorrow.                            Written discharge instructions were provided to the                            patient.                           - Written discharge instructions were provided to                            the patient.                           - The signs and symptoms of potential delayed                            complications were discussed with the patient.                           - Patient has a contact number available for                            emergencies.                           - Return to normal activities tomorrow.                           - Resume previous diet.                           - Continue present medications.                           - No repeat colonoscopy due to age at 70. Procedure Code(s):        --- Professional ---                           Z6109, Colorectal cancer screening; colonoscopy on                             individual not meeting criteria for high risk  G0500, Moderate sedation services provided by the                            same physician or other qualified health care                            professional performing a gastrointestinal                            endoscopic service that sedation supports,                            requiring the presence of an independent trained                            observer to assist in the monitoring of the                            patient's level of consciousness and physiological                            status; initial 15 minutes of intra-service time;                            patient age 42 years or older (additional time may                            be reported with 16109, as appropriate) Diagnosis Code(s):        --- Professional ---                           Z12.11, Encounter for screening for malignant                            neoplasm of colon CPT copyright 2016 [employer]. All rights reserved. The codes documented in this report are preliminary and upon coder review may  be revised to meet current compliance requirements. Franky Macho, MD Franky Macho, MD 03/08/2016 7:44:46 AM This report has been signed electronically. Number of Addenda: 0

## 2016-03-08 NOTE — Discharge Instructions (Signed)
Colonoscopy, Adult, Care After  This sheet gives you information about how to care for yourself after your procedure. Your health care provider may also give you more specific instructions. If you have problems or questions, contact your health care provider.  What can I expect after the procedure?  After the procedure, it is common to have:  · A small amount of blood in your stool for 24 hours after the procedure.  · Some gas.  · Mild abdominal cramping or bloating.    Follow these instructions at home:  General instructions     · For the first 24 hours after the procedure:  ? Do not drive or use machinery.  ? Do not sign important documents.  ? Do not drink alcohol.  ? Do your regular daily activities at a slower pace than normal.  ? Eat soft, easy-to-digest foods.  ? Rest often.  · Take over-the-counter or prescription medicines only as told by your health care provider.  · It is up to you to get the results of your procedure. Ask your health care provider, or the department performing the procedure, when your results will be ready.  Relieving cramping and bloating   · Try walking around when you have cramps or feel bloated.  · Apply heat to your abdomen as told by your health care provider. Use a heat source that your health care provider recommends, such as a moist heat pack or a heating pad.  ? Place a towel between your skin and the heat source.  ? Leave the heat on for 20-30 minutes.  ? Remove the heat if your skin turns bright red. This is especially important if you are unable to feel pain, heat, or cold. You may have a greater risk of getting burned.  Eating and drinking   · Drink enough fluid to keep your urine clear or pale yellow.  · Resume your normal diet as instructed by your health care provider. Avoid heavy or fried foods that are hard to digest.  · Avoid drinking alcohol for as long as instructed by your health care provider.  Contact a health care provider if:  · You have blood in your stool 2-3  days after the procedure.  Get help right away if:  · You have more than a small spotting of blood in your stool.  · You pass large blood clots in your stool.  · Your abdomen is swollen.  · You have nausea or vomiting.  · You have a fever.  · You have increasing abdominal pain that is not relieved with medicine.  This information is not intended to replace advice given to you by your health care provider. Make sure you discuss any questions you have with your health care provider.  Document Released: 10/27/2003 Document Revised: 12/07/2015 Document Reviewed: 05/26/2015  Elsevier Interactive Patient Education © 2017 [employer]

## 2016-03-08 NOTE — Interval H&P Note (Signed)
History and Physical Interval Note:  03/08/2016 7:27 AM  Douglas Waller  has presented today for surgery, with the diagnosis of screening  The various methods of treatment have been discussed with the patient and family. After consideration of risks, benefits and other options for treatment, the patient has consented to  Procedure(s): COLONOSCOPY (N/A) as a surgical intervention .  The patient's history has been reviewed, patient examined, no change in status, stable for surgery.  I have reviewed the patient's chart and labs.  Questions were answered to the patient's satisfaction.     Franky MachoJENKINS,Tenzin Edelman A

## 2016-03-11 ENCOUNTER — Encounter (HOSPITAL_COMMUNITY): Payer: Self-pay | Admitting: General Surgery

## 2016-04-28 DIAGNOSIS — Z6825 Body mass index (BMI) 25.0-25.9, adult: Secondary | ICD-10-CM | POA: Diagnosis not present

## 2016-04-28 DIAGNOSIS — Z23 Encounter for immunization: Secondary | ICD-10-CM | POA: Diagnosis not present

## 2016-04-28 DIAGNOSIS — G894 Chronic pain syndrome: Secondary | ICD-10-CM | POA: Diagnosis not present

## 2016-04-28 DIAGNOSIS — E1129 Type 2 diabetes mellitus with other diabetic kidney complication: Secondary | ICD-10-CM | POA: Diagnosis not present

## 2016-04-28 DIAGNOSIS — K219 Gastro-esophageal reflux disease without esophagitis: Secondary | ICD-10-CM | POA: Diagnosis not present

## 2016-05-30 DIAGNOSIS — H7312 Chronic myringitis, left ear: Secondary | ICD-10-CM | POA: Diagnosis not present

## 2016-05-30 DIAGNOSIS — H906 Mixed conductive and sensorineural hearing loss, bilateral: Secondary | ICD-10-CM | POA: Diagnosis not present

## 2016-05-30 DIAGNOSIS — H6983 Other specified disorders of Eustachian tube, bilateral: Secondary | ICD-10-CM | POA: Diagnosis not present

## 2016-06-22 DIAGNOSIS — G894 Chronic pain syndrome: Secondary | ICD-10-CM | POA: Diagnosis not present

## 2016-06-22 DIAGNOSIS — E1129 Type 2 diabetes mellitus with other diabetic kidney complication: Secondary | ICD-10-CM | POA: Diagnosis not present

## 2016-06-22 DIAGNOSIS — J329 Chronic sinusitis, unspecified: Secondary | ICD-10-CM | POA: Diagnosis not present

## 2016-06-22 DIAGNOSIS — J9801 Acute bronchospasm: Secondary | ICD-10-CM | POA: Diagnosis not present

## 2016-06-22 DIAGNOSIS — E782 Mixed hyperlipidemia: Secondary | ICD-10-CM | POA: Diagnosis not present

## 2016-06-22 DIAGNOSIS — N400 Enlarged prostate without lower urinary tract symptoms: Secondary | ICD-10-CM | POA: Diagnosis not present

## 2016-07-06 DIAGNOSIS — H906 Mixed conductive and sensorineural hearing loss, bilateral: Secondary | ICD-10-CM | POA: Diagnosis not present

## 2016-07-06 DIAGNOSIS — H6983 Other specified disorders of Eustachian tube, bilateral: Secondary | ICD-10-CM | POA: Diagnosis not present

## 2016-07-08 ENCOUNTER — Other Ambulatory Visit: Payer: Self-pay | Admitting: Otolaryngology

## 2016-07-08 DIAGNOSIS — H7312 Chronic myringitis, left ear: Secondary | ICD-10-CM

## 2016-08-18 ENCOUNTER — Ambulatory Visit
Admission: RE | Admit: 2016-08-18 | Discharge: 2016-08-18 | Disposition: A | Discharge status: Home / Self Care | Payer: Medicare Other | Source: Ambulatory Visit | Attending: Otolaryngology | Admitting: Otolaryngology

## 2016-08-18 DIAGNOSIS — J341 Cyst and mucocele of nose and nasal sinus: Secondary | ICD-10-CM | POA: Diagnosis not present

## 2016-08-18 DIAGNOSIS — H7312 Chronic myringitis, left ear: Secondary | ICD-10-CM

## 2016-08-18 MED ORDER — IOPAMIDOL (ISOVUE-300) INJECTION 61%
75.0000 mL | Freq: Once | INTRAVENOUS | Status: AC | PRN
  Administered 2016-08-18: 75 mL via INTRAVENOUS

## 2016-09-21 DIAGNOSIS — H9312 Tinnitus, left ear: Secondary | ICD-10-CM | POA: Diagnosis not present

## 2016-09-21 DIAGNOSIS — H6983 Other specified disorders of Eustachian tube, bilateral: Secondary | ICD-10-CM | POA: Diagnosis not present

## 2016-09-21 DIAGNOSIS — H6612 Chronic tubotympanic suppurative otitis media, left ear: Secondary | ICD-10-CM | POA: Diagnosis not present

## 2016-09-21 DIAGNOSIS — H906 Mixed conductive and sensorineural hearing loss, bilateral: Secondary | ICD-10-CM | POA: Diagnosis not present

## 2016-10-10 DIAGNOSIS — Z0000 Encounter for general adult medical examination without abnormal findings: Secondary | ICD-10-CM | POA: Diagnosis not present

## 2016-10-10 DIAGNOSIS — K219 Gastro-esophageal reflux disease without esophagitis: Secondary | ICD-10-CM | POA: Diagnosis not present

## 2016-10-10 DIAGNOSIS — Z1389 Encounter for screening for other disorder: Secondary | ICD-10-CM | POA: Diagnosis not present

## 2016-10-10 DIAGNOSIS — N400 Enlarged prostate without lower urinary tract symptoms: Secondary | ICD-10-CM | POA: Diagnosis not present

## 2016-10-10 DIAGNOSIS — E119 Type 2 diabetes mellitus without complications: Secondary | ICD-10-CM | POA: Diagnosis not present

## 2016-10-10 DIAGNOSIS — Z6827 Body mass index (BMI) 27.0-27.9, adult: Secondary | ICD-10-CM | POA: Diagnosis not present

## 2016-10-10 DIAGNOSIS — H7012 Chronic mastoiditis, left ear: Secondary | ICD-10-CM | POA: Diagnosis not present

## 2016-10-10 DIAGNOSIS — E663 Overweight: Secondary | ICD-10-CM | POA: Diagnosis not present

## 2016-10-14 ENCOUNTER — Other Ambulatory Visit (HOSPITAL_COMMUNITY): Payer: Self-pay | Admitting: Internal Medicine

## 2016-10-14 ENCOUNTER — Ambulatory Visit (HOSPITAL_COMMUNITY)
Admission: RE | Admit: 2016-10-14 | Discharge: 2016-10-14 | Disposition: A | Discharge status: Home / Self Care | Payer: Medicare Other | Source: Ambulatory Visit | Attending: Internal Medicine | Admitting: Internal Medicine

## 2016-10-14 DIAGNOSIS — R05 Cough: Secondary | ICD-10-CM

## 2016-10-14 DIAGNOSIS — R918 Other nonspecific abnormal finding of lung field: Secondary | ICD-10-CM | POA: Insufficient documentation

## 2016-10-14 DIAGNOSIS — I700 Atherosclerosis of aorta: Secondary | ICD-10-CM | POA: Diagnosis not present

## 2016-10-14 DIAGNOSIS — R059 Cough, unspecified: Secondary | ICD-10-CM

## 2017-02-08 DIAGNOSIS — N400 Enlarged prostate without lower urinary tract symptoms: Secondary | ICD-10-CM | POA: Diagnosis not present

## 2017-02-08 DIAGNOSIS — H6983 Other specified disorders of Eustachian tube, bilateral: Secondary | ICD-10-CM | POA: Diagnosis not present

## 2017-02-08 DIAGNOSIS — H906 Mixed conductive and sensorineural hearing loss, bilateral: Secondary | ICD-10-CM | POA: Diagnosis not present

## 2017-02-08 DIAGNOSIS — Z23 Encounter for immunization: Secondary | ICD-10-CM | POA: Diagnosis not present

## 2017-02-08 DIAGNOSIS — Z1389 Encounter for screening for other disorder: Secondary | ICD-10-CM | POA: Diagnosis not present

## 2017-02-08 DIAGNOSIS — E663 Overweight: Secondary | ICD-10-CM | POA: Diagnosis not present

## 2017-02-08 DIAGNOSIS — E119 Type 2 diabetes mellitus without complications: Secondary | ICD-10-CM | POA: Diagnosis not present

## 2017-02-08 DIAGNOSIS — H9312 Tinnitus, left ear: Secondary | ICD-10-CM | POA: Diagnosis not present

## 2017-02-08 DIAGNOSIS — E782 Mixed hyperlipidemia: Secondary | ICD-10-CM | POA: Diagnosis not present

## 2017-02-08 DIAGNOSIS — H6612 Chronic tubotympanic suppurative otitis media, left ear: Secondary | ICD-10-CM | POA: Diagnosis not present

## 2017-02-08 DIAGNOSIS — Z6827 Body mass index (BMI) 27.0-27.9, adult: Secondary | ICD-10-CM | POA: Diagnosis not present

## 2017-02-08 DIAGNOSIS — H0011 Chalazion right upper eyelid: Secondary | ICD-10-CM | POA: Diagnosis not present

## 2017-02-20 ENCOUNTER — Other Ambulatory Visit: Payer: Self-pay | Admitting: Otolaryngology

## 2017-02-20 DIAGNOSIS — H6042 Cholesteatoma of left external ear: Secondary | ICD-10-CM | POA: Diagnosis not present

## 2017-02-20 DIAGNOSIS — H74312 Ankylosis of ear ossicles, left ear: Secondary | ICD-10-CM | POA: Diagnosis not present

## 2017-02-20 DIAGNOSIS — H663X2 Other chronic suppurative otitis media, left ear: Secondary | ICD-10-CM | POA: Diagnosis not present

## 2017-02-20 DIAGNOSIS — H7012 Chronic mastoiditis, left ear: Secondary | ICD-10-CM | POA: Diagnosis not present

## 2017-02-20 DIAGNOSIS — H7122 Cholesteatoma of mastoid, left ear: Secondary | ICD-10-CM | POA: Diagnosis not present

## 2017-02-20 DIAGNOSIS — H906 Mixed conductive and sensorineural hearing loss, bilateral: Secondary | ICD-10-CM | POA: Diagnosis not present

## 2017-02-20 DIAGNOSIS — H9312 Tinnitus, left ear: Secondary | ICD-10-CM | POA: Diagnosis not present

## 2017-02-20 DIAGNOSIS — H6983 Other specified disorders of Eustachian tube, bilateral: Secondary | ICD-10-CM | POA: Diagnosis not present

## 2017-04-06 DIAGNOSIS — H26493 Other secondary cataract, bilateral: Secondary | ICD-10-CM | POA: Diagnosis not present

## 2017-04-06 DIAGNOSIS — H524 Presbyopia: Secondary | ICD-10-CM | POA: Diagnosis not present

## 2017-04-11 DIAGNOSIS — H906 Mixed conductive and sensorineural hearing loss, bilateral: Secondary | ICD-10-CM | POA: Diagnosis not present

## 2017-04-11 DIAGNOSIS — H6983 Other specified disorders of Eustachian tube, bilateral: Secondary | ICD-10-CM | POA: Diagnosis not present

## 2017-04-11 DIAGNOSIS — H9312 Tinnitus, left ear: Secondary | ICD-10-CM | POA: Diagnosis not present

## 2017-05-01 DIAGNOSIS — Z1389 Encounter for screening for other disorder: Secondary | ICD-10-CM | POA: Diagnosis not present

## 2017-05-01 DIAGNOSIS — R300 Dysuria: Secondary | ICD-10-CM | POA: Diagnosis not present

## 2017-05-01 DIAGNOSIS — Z6827 Body mass index (BMI) 27.0-27.9, adult: Secondary | ICD-10-CM | POA: Diagnosis not present

## 2017-05-01 DIAGNOSIS — N410 Acute prostatitis: Secondary | ICD-10-CM | POA: Diagnosis not present

## 2017-05-01 DIAGNOSIS — K219 Gastro-esophageal reflux disease without esophagitis: Secondary | ICD-10-CM | POA: Diagnosis not present

## 2017-07-28 DIAGNOSIS — E1129 Type 2 diabetes mellitus with other diabetic kidney complication: Secondary | ICD-10-CM | POA: Diagnosis not present

## 2017-07-28 DIAGNOSIS — Z6826 Body mass index (BMI) 26.0-26.9, adult: Secondary | ICD-10-CM | POA: Diagnosis not present

## 2017-07-28 DIAGNOSIS — R5383 Other fatigue: Secondary | ICD-10-CM | POA: Diagnosis not present

## 2017-07-28 DIAGNOSIS — Z1389 Encounter for screening for other disorder: Secondary | ICD-10-CM | POA: Diagnosis not present

## 2017-07-28 DIAGNOSIS — N400 Enlarged prostate without lower urinary tract symptoms: Secondary | ICD-10-CM | POA: Diagnosis not present

## 2017-07-28 DIAGNOSIS — I700 Atherosclerosis of aorta: Secondary | ICD-10-CM | POA: Diagnosis not present

## 2017-07-28 DIAGNOSIS — G894 Chronic pain syndrome: Secondary | ICD-10-CM | POA: Diagnosis not present

## 2017-07-28 DIAGNOSIS — E663 Overweight: Secondary | ICD-10-CM | POA: Diagnosis not present

## 2017-10-12 DIAGNOSIS — H7312 Chronic myringitis, left ear: Secondary | ICD-10-CM | POA: Diagnosis not present

## 2017-10-12 DIAGNOSIS — H906 Mixed conductive and sensorineural hearing loss, bilateral: Secondary | ICD-10-CM | POA: Diagnosis not present

## 2017-10-12 DIAGNOSIS — H9312 Tinnitus, left ear: Secondary | ICD-10-CM | POA: Diagnosis not present

## 2017-10-12 DIAGNOSIS — H6042 Cholesteatoma of left external ear: Secondary | ICD-10-CM | POA: Diagnosis not present

## 2017-10-12 DIAGNOSIS — H6983 Other specified disorders of Eustachian tube, bilateral: Secondary | ICD-10-CM | POA: Diagnosis not present

## 2017-10-27 DIAGNOSIS — E119 Type 2 diabetes mellitus without complications: Secondary | ICD-10-CM | POA: Diagnosis not present

## 2017-10-27 DIAGNOSIS — R946 Abnormal results of thyroid function studies: Secondary | ICD-10-CM | POA: Diagnosis not present

## 2017-10-27 DIAGNOSIS — E663 Overweight: Secondary | ICD-10-CM | POA: Diagnosis not present

## 2017-10-27 DIAGNOSIS — Z0001 Encounter for general adult medical examination with abnormal findings: Secondary | ICD-10-CM | POA: Diagnosis not present

## 2017-10-27 DIAGNOSIS — Z6827 Body mass index (BMI) 27.0-27.9, adult: Secondary | ICD-10-CM | POA: Diagnosis not present

## 2017-10-27 DIAGNOSIS — K219 Gastro-esophageal reflux disease without esophagitis: Secondary | ICD-10-CM | POA: Diagnosis not present

## 2017-10-27 DIAGNOSIS — G894 Chronic pain syndrome: Secondary | ICD-10-CM | POA: Diagnosis not present

## 2017-10-27 DIAGNOSIS — Z125 Encounter for screening for malignant neoplasm of prostate: Secondary | ICD-10-CM | POA: Diagnosis not present

## 2017-10-27 DIAGNOSIS — Z1389 Encounter for screening for other disorder: Secondary | ICD-10-CM | POA: Diagnosis not present

## 2017-10-27 DIAGNOSIS — E782 Mixed hyperlipidemia: Secondary | ICD-10-CM | POA: Diagnosis not present

## 2017-10-27 DIAGNOSIS — E785 Hyperlipidemia, unspecified: Secondary | ICD-10-CM | POA: Diagnosis not present

## 2017-10-27 DIAGNOSIS — N401 Enlarged prostate with lower urinary tract symptoms: Secondary | ICD-10-CM | POA: Diagnosis not present

## 2017-11-03 DIAGNOSIS — R7309 Other abnormal glucose: Secondary | ICD-10-CM | POA: Diagnosis not present

## 2018-01-15 DIAGNOSIS — Z6826 Body mass index (BMI) 26.0-26.9, adult: Secondary | ICD-10-CM | POA: Diagnosis not present

## 2018-01-15 DIAGNOSIS — N400 Enlarged prostate without lower urinary tract symptoms: Secondary | ICD-10-CM | POA: Diagnosis not present

## 2018-01-15 DIAGNOSIS — Z23 Encounter for immunization: Secondary | ICD-10-CM | POA: Diagnosis not present

## 2018-01-15 DIAGNOSIS — G894 Chronic pain syndrome: Secondary | ICD-10-CM | POA: Diagnosis not present

## 2018-01-15 DIAGNOSIS — K219 Gastro-esophageal reflux disease without esophagitis: Secondary | ICD-10-CM | POA: Diagnosis not present

## 2018-01-15 DIAGNOSIS — E119 Type 2 diabetes mellitus without complications: Secondary | ICD-10-CM | POA: Diagnosis not present

## 2018-01-15 DIAGNOSIS — B354 Tinea corporis: Secondary | ICD-10-CM | POA: Diagnosis not present

## 2018-01-15 DIAGNOSIS — Z1389 Encounter for screening for other disorder: Secondary | ICD-10-CM | POA: Diagnosis not present

## 2018-01-25 DIAGNOSIS — H906 Mixed conductive and sensorineural hearing loss, bilateral: Secondary | ICD-10-CM | POA: Diagnosis not present

## 2018-01-25 DIAGNOSIS — H9312 Tinnitus, left ear: Secondary | ICD-10-CM | POA: Diagnosis not present

## 2018-01-25 DIAGNOSIS — H6983 Other specified disorders of Eustachian tube, bilateral: Secondary | ICD-10-CM | POA: Diagnosis not present

## 2018-01-25 DIAGNOSIS — H7202 Central perforation of tympanic membrane, left ear: Secondary | ICD-10-CM | POA: Diagnosis not present

## 2018-04-12 DIAGNOSIS — H26493 Other secondary cataract, bilateral: Secondary | ICD-10-CM | POA: Diagnosis not present

## 2018-04-12 DIAGNOSIS — H524 Presbyopia: Secondary | ICD-10-CM | POA: Diagnosis not present

## 2018-04-23 DIAGNOSIS — I700 Atherosclerosis of aorta: Secondary | ICD-10-CM | POA: Diagnosis not present

## 2018-04-23 DIAGNOSIS — M62830 Muscle spasm of back: Secondary | ICD-10-CM | POA: Diagnosis not present

## 2018-04-23 DIAGNOSIS — E1129 Type 2 diabetes mellitus with other diabetic kidney complication: Secondary | ICD-10-CM | POA: Diagnosis not present

## 2018-04-23 DIAGNOSIS — K219 Gastro-esophageal reflux disease without esophagitis: Secondary | ICD-10-CM | POA: Diagnosis not present

## 2018-04-23 DIAGNOSIS — Z1389 Encounter for screening for other disorder: Secondary | ICD-10-CM | POA: Diagnosis not present

## 2018-04-23 DIAGNOSIS — Z6828 Body mass index (BMI) 28.0-28.9, adult: Secondary | ICD-10-CM | POA: Diagnosis not present

## 2018-05-23 DIAGNOSIS — N400 Enlarged prostate without lower urinary tract symptoms: Secondary | ICD-10-CM | POA: Diagnosis not present

## 2018-05-23 DIAGNOSIS — E663 Overweight: Secondary | ICD-10-CM | POA: Diagnosis not present

## 2018-05-23 DIAGNOSIS — G894 Chronic pain syndrome: Secondary | ICD-10-CM | POA: Diagnosis not present

## 2018-05-23 DIAGNOSIS — Z6829 Body mass index (BMI) 29.0-29.9, adult: Secondary | ICD-10-CM | POA: Diagnosis not present

## 2018-06-12 DIAGNOSIS — Z1389 Encounter for screening for other disorder: Secondary | ICD-10-CM | POA: Diagnosis not present

## 2018-06-12 DIAGNOSIS — E663 Overweight: Secondary | ICD-10-CM | POA: Diagnosis not present

## 2018-06-12 DIAGNOSIS — K219 Gastro-esophageal reflux disease without esophagitis: Secondary | ICD-10-CM | POA: Diagnosis not present

## 2018-06-12 DIAGNOSIS — E119 Type 2 diabetes mellitus without complications: Secondary | ICD-10-CM | POA: Diagnosis not present

## 2018-06-12 DIAGNOSIS — G894 Chronic pain syndrome: Secondary | ICD-10-CM | POA: Diagnosis not present

## 2018-06-12 DIAGNOSIS — N400 Enlarged prostate without lower urinary tract symptoms: Secondary | ICD-10-CM | POA: Diagnosis not present

## 2018-06-18 DIAGNOSIS — H7202 Central perforation of tympanic membrane, left ear: Secondary | ICD-10-CM | POA: Diagnosis not present

## 2018-06-18 DIAGNOSIS — H6983 Other specified disorders of Eustachian tube, bilateral: Secondary | ICD-10-CM | POA: Diagnosis not present

## 2018-06-18 DIAGNOSIS — H9312 Tinnitus, left ear: Secondary | ICD-10-CM | POA: Diagnosis not present

## 2018-06-18 DIAGNOSIS — H906 Mixed conductive and sensorineural hearing loss, bilateral: Secondary | ICD-10-CM | POA: Diagnosis not present

## 2018-07-16 DIAGNOSIS — K219 Gastro-esophageal reflux disease without esophagitis: Secondary | ICD-10-CM | POA: Diagnosis not present

## 2018-07-16 DIAGNOSIS — E119 Type 2 diabetes mellitus without complications: Secondary | ICD-10-CM | POA: Diagnosis not present

## 2018-07-16 DIAGNOSIS — Z681 Body mass index (BMI) 19 or less, adult: Secondary | ICD-10-CM | POA: Diagnosis not present

## 2018-07-16 DIAGNOSIS — G894 Chronic pain syndrome: Secondary | ICD-10-CM | POA: Diagnosis not present

## 2018-08-16 DIAGNOSIS — N400 Enlarged prostate without lower urinary tract symptoms: Secondary | ICD-10-CM | POA: Diagnosis not present

## 2018-08-16 DIAGNOSIS — Z6828 Body mass index (BMI) 28.0-28.9, adult: Secondary | ICD-10-CM | POA: Diagnosis not present

## 2018-08-16 DIAGNOSIS — E119 Type 2 diabetes mellitus without complications: Secondary | ICD-10-CM | POA: Diagnosis not present

## 2018-08-16 DIAGNOSIS — Z1389 Encounter for screening for other disorder: Secondary | ICD-10-CM | POA: Diagnosis not present

## 2018-08-16 DIAGNOSIS — G894 Chronic pain syndrome: Secondary | ICD-10-CM | POA: Diagnosis not present

## 2018-09-13 DIAGNOSIS — E663 Overweight: Secondary | ICD-10-CM | POA: Diagnosis not present

## 2018-09-13 DIAGNOSIS — Z6828 Body mass index (BMI) 28.0-28.9, adult: Secondary | ICD-10-CM | POA: Diagnosis not present

## 2018-09-13 DIAGNOSIS — G894 Chronic pain syndrome: Secondary | ICD-10-CM | POA: Diagnosis not present

## 2018-09-13 DIAGNOSIS — N400 Enlarged prostate without lower urinary tract symptoms: Secondary | ICD-10-CM | POA: Diagnosis not present

## 2018-10-11 DIAGNOSIS — E663 Overweight: Secondary | ICD-10-CM | POA: Diagnosis not present

## 2018-10-11 DIAGNOSIS — Z6828 Body mass index (BMI) 28.0-28.9, adult: Secondary | ICD-10-CM | POA: Diagnosis not present

## 2018-10-11 DIAGNOSIS — G894 Chronic pain syndrome: Secondary | ICD-10-CM | POA: Diagnosis not present

## 2018-11-08 DIAGNOSIS — G894 Chronic pain syndrome: Secondary | ICD-10-CM | POA: Diagnosis not present

## 2018-11-08 DIAGNOSIS — Z0000 Encounter for general adult medical examination without abnormal findings: Secondary | ICD-10-CM | POA: Diagnosis not present

## 2018-11-08 DIAGNOSIS — E663 Overweight: Secondary | ICD-10-CM | POA: Diagnosis not present

## 2018-11-08 DIAGNOSIS — Z6828 Body mass index (BMI) 28.0-28.9, adult: Secondary | ICD-10-CM | POA: Diagnosis not present

## 2018-12-06 DIAGNOSIS — M5136 Other intervertebral disc degeneration, lumbar region: Secondary | ICD-10-CM | POA: Diagnosis not present

## 2018-12-06 DIAGNOSIS — G894 Chronic pain syndrome: Secondary | ICD-10-CM | POA: Diagnosis not present

## 2018-12-06 DIAGNOSIS — E663 Overweight: Secondary | ICD-10-CM | POA: Diagnosis not present

## 2018-12-06 DIAGNOSIS — Z6828 Body mass index (BMI) 28.0-28.9, adult: Secondary | ICD-10-CM | POA: Diagnosis not present

## 2019-01-04 DIAGNOSIS — G894 Chronic pain syndrome: Secondary | ICD-10-CM | POA: Diagnosis not present

## 2019-01-04 DIAGNOSIS — M5417 Radiculopathy, lumbosacral region: Secondary | ICD-10-CM | POA: Diagnosis not present

## 2019-01-04 DIAGNOSIS — G2581 Restless legs syndrome: Secondary | ICD-10-CM | POA: Diagnosis not present

## 2019-01-04 DIAGNOSIS — Z6827 Body mass index (BMI) 27.0-27.9, adult: Secondary | ICD-10-CM | POA: Diagnosis not present

## 2019-01-08 DIAGNOSIS — M48062 Spinal stenosis, lumbar region with neurogenic claudication: Secondary | ICD-10-CM | POA: Diagnosis not present

## 2019-01-09 ENCOUNTER — Other Ambulatory Visit: Payer: Self-pay | Admitting: Neurosurgery

## 2019-01-09 ENCOUNTER — Other Ambulatory Visit (HOSPITAL_COMMUNITY): Payer: Self-pay | Admitting: Neurosurgery

## 2019-01-09 DIAGNOSIS — M48062 Spinal stenosis, lumbar region with neurogenic claudication: Secondary | ICD-10-CM

## 2019-01-16 ENCOUNTER — Ambulatory Visit (HOSPITAL_COMMUNITY)
Admission: RE | Admit: 2019-01-16 | Discharge: 2019-01-16 | Disposition: A | Discharge status: Home / Self Care | Payer: Medicare Other | Source: Ambulatory Visit | Attending: Neurosurgery | Admitting: Neurosurgery

## 2019-01-16 ENCOUNTER — Other Ambulatory Visit: Payer: Self-pay

## 2019-01-16 DIAGNOSIS — M48062 Spinal stenosis, lumbar region with neurogenic claudication: Secondary | ICD-10-CM | POA: Insufficient documentation

## 2019-01-16 DIAGNOSIS — M48061 Spinal stenosis, lumbar region without neurogenic claudication: Secondary | ICD-10-CM | POA: Diagnosis not present

## 2019-01-16 LAB — POCT I-STAT CREATININE: Creatinine, Ser: 0.9 mg/dL (ref 0.61–1.24)

## 2019-01-16 MED ORDER — GADOBUTROL 1 MMOL/ML IV SOLN
8.0000 mL | Freq: Once | INTRAVENOUS | Status: AC | PRN
  Administered 2019-01-16: 8 mL via INTRAVENOUS

## 2019-01-22 DIAGNOSIS — M48062 Spinal stenosis, lumbar region with neurogenic claudication: Secondary | ICD-10-CM | POA: Diagnosis not present

## 2019-02-01 DIAGNOSIS — Z719 Counseling, unspecified: Secondary | ICD-10-CM | POA: Diagnosis not present

## 2019-02-01 DIAGNOSIS — N182 Chronic kidney disease, stage 2 (mild): Secondary | ICD-10-CM | POA: Diagnosis not present

## 2019-02-01 DIAGNOSIS — Z6827 Body mass index (BMI) 27.0-27.9, adult: Secondary | ICD-10-CM | POA: Diagnosis not present

## 2019-02-01 DIAGNOSIS — E663 Overweight: Secondary | ICD-10-CM | POA: Diagnosis not present

## 2019-02-01 DIAGNOSIS — N400 Enlarged prostate without lower urinary tract symptoms: Secondary | ICD-10-CM | POA: Diagnosis not present

## 2019-02-01 DIAGNOSIS — R202 Paresthesia of skin: Secondary | ICD-10-CM | POA: Diagnosis not present

## 2019-02-01 DIAGNOSIS — F17290 Nicotine dependence, other tobacco product, uncomplicated: Secondary | ICD-10-CM | POA: Diagnosis not present

## 2019-02-01 DIAGNOSIS — G894 Chronic pain syndrome: Secondary | ICD-10-CM | POA: Diagnosis not present

## 2019-02-01 DIAGNOSIS — K219 Gastro-esophageal reflux disease without esophagitis: Secondary | ICD-10-CM | POA: Diagnosis not present

## 2019-02-01 DIAGNOSIS — Z23 Encounter for immunization: Secondary | ICD-10-CM | POA: Diagnosis not present

## 2019-02-11 DIAGNOSIS — M545 Low back pain: Secondary | ICD-10-CM | POA: Diagnosis not present

## 2019-02-11 DIAGNOSIS — M48061 Spinal stenosis, lumbar region without neurogenic claudication: Secondary | ICD-10-CM | POA: Diagnosis not present

## 2019-02-11 DIAGNOSIS — G8929 Other chronic pain: Secondary | ICD-10-CM | POA: Diagnosis not present

## 2019-02-11 DIAGNOSIS — Z6829 Body mass index (BMI) 29.0-29.9, adult: Secondary | ICD-10-CM | POA: Diagnosis not present

## 2019-02-11 DIAGNOSIS — M5136 Other intervertebral disc degeneration, lumbar region: Secondary | ICD-10-CM | POA: Diagnosis not present

## 2019-02-11 DIAGNOSIS — I10 Essential (primary) hypertension: Secondary | ICD-10-CM | POA: Diagnosis not present

## 2019-02-11 DIAGNOSIS — M47816 Spondylosis without myelopathy or radiculopathy, lumbar region: Secondary | ICD-10-CM | POA: Diagnosis not present

## 2019-02-25 DIAGNOSIS — Z6828 Body mass index (BMI) 28.0-28.9, adult: Secondary | ICD-10-CM | POA: Diagnosis not present

## 2019-02-25 DIAGNOSIS — E782 Mixed hyperlipidemia: Secondary | ICD-10-CM | POA: Diagnosis not present

## 2019-02-25 DIAGNOSIS — Z0000 Encounter for general adult medical examination without abnormal findings: Secondary | ICD-10-CM | POA: Diagnosis not present

## 2019-02-25 DIAGNOSIS — Z1389 Encounter for screening for other disorder: Secondary | ICD-10-CM | POA: Diagnosis not present

## 2019-03-04 DIAGNOSIS — G894 Chronic pain syndrome: Secondary | ICD-10-CM | POA: Diagnosis not present

## 2019-03-04 DIAGNOSIS — Z6828 Body mass index (BMI) 28.0-28.9, adult: Secondary | ICD-10-CM | POA: Diagnosis not present

## 2019-03-04 DIAGNOSIS — E663 Overweight: Secondary | ICD-10-CM | POA: Diagnosis not present

## 2019-03-04 DIAGNOSIS — N400 Enlarged prostate without lower urinary tract symptoms: Secondary | ICD-10-CM | POA: Diagnosis not present

## 2019-03-28 DIAGNOSIS — N182 Chronic kidney disease, stage 2 (mild): Secondary | ICD-10-CM | POA: Diagnosis not present

## 2019-03-28 DIAGNOSIS — F4542 Pain disorder with related psychological factors: Secondary | ICD-10-CM | POA: Diagnosis not present

## 2019-03-28 DIAGNOSIS — E1122 Type 2 diabetes mellitus with diabetic chronic kidney disease: Secondary | ICD-10-CM | POA: Diagnosis not present

## 2019-03-28 DIAGNOSIS — G894 Chronic pain syndrome: Secondary | ICD-10-CM | POA: Diagnosis not present

## 2019-04-25 DIAGNOSIS — I700 Atherosclerosis of aorta: Secondary | ICD-10-CM | POA: Diagnosis not present

## 2019-04-25 DIAGNOSIS — Z6828 Body mass index (BMI) 28.0-28.9, adult: Secondary | ICD-10-CM | POA: Diagnosis not present

## 2019-04-25 DIAGNOSIS — G894 Chronic pain syndrome: Secondary | ICD-10-CM | POA: Diagnosis not present

## 2019-04-25 DIAGNOSIS — K219 Gastro-esophageal reflux disease without esophagitis: Secondary | ICD-10-CM | POA: Diagnosis not present

## 2019-04-25 DIAGNOSIS — M47816 Spondylosis without myelopathy or radiculopathy, lumbar region: Secondary | ICD-10-CM | POA: Diagnosis not present

## 2019-04-25 DIAGNOSIS — N400 Enlarged prostate without lower urinary tract symptoms: Secondary | ICD-10-CM | POA: Diagnosis not present

## 2019-04-25 DIAGNOSIS — N182 Chronic kidney disease, stage 2 (mild): Secondary | ICD-10-CM | POA: Diagnosis not present

## 2019-04-25 DIAGNOSIS — E1129 Type 2 diabetes mellitus with other diabetic kidney complication: Secondary | ICD-10-CM | POA: Diagnosis not present

## 2019-04-28 DIAGNOSIS — E1122 Type 2 diabetes mellitus with diabetic chronic kidney disease: Secondary | ICD-10-CM | POA: Diagnosis not present

## 2019-04-28 DIAGNOSIS — N182 Chronic kidney disease, stage 2 (mild): Secondary | ICD-10-CM | POA: Diagnosis not present

## 2019-04-28 DIAGNOSIS — F4542 Pain disorder with related psychological factors: Secondary | ICD-10-CM | POA: Diagnosis not present

## 2019-04-28 DIAGNOSIS — G894 Chronic pain syndrome: Secondary | ICD-10-CM | POA: Diagnosis not present

## 2019-05-27 DIAGNOSIS — K219 Gastro-esophageal reflux disease without esophagitis: Secondary | ICD-10-CM | POA: Diagnosis not present

## 2019-05-27 DIAGNOSIS — Z6828 Body mass index (BMI) 28.0-28.9, adult: Secondary | ICD-10-CM | POA: Diagnosis not present

## 2019-05-27 DIAGNOSIS — G894 Chronic pain syndrome: Secondary | ICD-10-CM | POA: Diagnosis not present

## 2019-05-27 DIAGNOSIS — E1129 Type 2 diabetes mellitus with other diabetic kidney complication: Secondary | ICD-10-CM | POA: Diagnosis not present

## 2019-06-26 DIAGNOSIS — N400 Enlarged prostate without lower urinary tract symptoms: Secondary | ICD-10-CM | POA: Diagnosis not present

## 2019-06-26 DIAGNOSIS — E1122 Type 2 diabetes mellitus with diabetic chronic kidney disease: Secondary | ICD-10-CM | POA: Diagnosis not present

## 2019-06-26 DIAGNOSIS — E7849 Other hyperlipidemia: Secondary | ICD-10-CM | POA: Diagnosis not present

## 2019-06-26 DIAGNOSIS — N182 Chronic kidney disease, stage 2 (mild): Secondary | ICD-10-CM | POA: Diagnosis not present

## 2019-06-27 DIAGNOSIS — N182 Chronic kidney disease, stage 2 (mild): Secondary | ICD-10-CM | POA: Diagnosis not present

## 2019-06-27 DIAGNOSIS — K219 Gastro-esophageal reflux disease without esophagitis: Secondary | ICD-10-CM | POA: Diagnosis not present

## 2019-06-27 DIAGNOSIS — M1991 Primary osteoarthritis, unspecified site: Secondary | ICD-10-CM | POA: Diagnosis not present

## 2019-06-27 DIAGNOSIS — E663 Overweight: Secondary | ICD-10-CM | POA: Diagnosis not present

## 2019-06-27 DIAGNOSIS — E119 Type 2 diabetes mellitus without complications: Secondary | ICD-10-CM | POA: Diagnosis not present

## 2019-06-27 DIAGNOSIS — G894 Chronic pain syndrome: Secondary | ICD-10-CM | POA: Diagnosis not present

## 2019-06-27 DIAGNOSIS — Z6827 Body mass index (BMI) 27.0-27.9, adult: Secondary | ICD-10-CM | POA: Diagnosis not present

## 2019-07-25 DIAGNOSIS — Z6827 Body mass index (BMI) 27.0-27.9, adult: Secondary | ICD-10-CM | POA: Diagnosis not present

## 2019-07-25 DIAGNOSIS — G894 Chronic pain syndrome: Secondary | ICD-10-CM | POA: Diagnosis not present

## 2019-07-25 DIAGNOSIS — E663 Overweight: Secondary | ICD-10-CM | POA: Diagnosis not present

## 2019-07-25 DIAGNOSIS — M5136 Other intervertebral disc degeneration, lumbar region: Secondary | ICD-10-CM | POA: Diagnosis not present

## 2019-07-25 DIAGNOSIS — M48061 Spinal stenosis, lumbar region without neurogenic claudication: Secondary | ICD-10-CM | POA: Diagnosis not present

## 2019-07-26 DIAGNOSIS — N182 Chronic kidney disease, stage 2 (mild): Secondary | ICD-10-CM | POA: Diagnosis not present

## 2019-07-26 DIAGNOSIS — E7849 Other hyperlipidemia: Secondary | ICD-10-CM | POA: Diagnosis not present

## 2019-07-26 DIAGNOSIS — G894 Chronic pain syndrome: Secondary | ICD-10-CM | POA: Diagnosis not present

## 2019-07-26 DIAGNOSIS — E1122 Type 2 diabetes mellitus with diabetic chronic kidney disease: Secondary | ICD-10-CM | POA: Diagnosis not present

## 2019-07-26 DIAGNOSIS — N400 Enlarged prostate without lower urinary tract symptoms: Secondary | ICD-10-CM | POA: Diagnosis not present

## 2019-08-22 DIAGNOSIS — Z6828 Body mass index (BMI) 28.0-28.9, adult: Secondary | ICD-10-CM | POA: Diagnosis not present

## 2019-08-22 DIAGNOSIS — K219 Gastro-esophageal reflux disease without esophagitis: Secondary | ICD-10-CM | POA: Diagnosis not present

## 2019-08-22 DIAGNOSIS — E119 Type 2 diabetes mellitus without complications: Secondary | ICD-10-CM | POA: Diagnosis not present

## 2019-08-22 DIAGNOSIS — G894 Chronic pain syndrome: Secondary | ICD-10-CM | POA: Diagnosis not present

## 2019-09-25 DIAGNOSIS — G894 Chronic pain syndrome: Secondary | ICD-10-CM | POA: Diagnosis not present

## 2019-09-25 DIAGNOSIS — N182 Chronic kidney disease, stage 2 (mild): Secondary | ICD-10-CM | POA: Diagnosis not present

## 2019-09-25 DIAGNOSIS — E1122 Type 2 diabetes mellitus with diabetic chronic kidney disease: Secondary | ICD-10-CM | POA: Diagnosis not present

## 2019-09-25 DIAGNOSIS — N400 Enlarged prostate without lower urinary tract symptoms: Secondary | ICD-10-CM | POA: Diagnosis not present

## 2019-10-17 DIAGNOSIS — M1991 Primary osteoarthritis, unspecified site: Secondary | ICD-10-CM | POA: Diagnosis not present

## 2019-10-17 DIAGNOSIS — M48061 Spinal stenosis, lumbar region without neurogenic claudication: Secondary | ICD-10-CM | POA: Diagnosis not present

## 2019-10-17 DIAGNOSIS — Z6827 Body mass index (BMI) 27.0-27.9, adult: Secondary | ICD-10-CM | POA: Diagnosis not present

## 2019-10-17 DIAGNOSIS — E663 Overweight: Secondary | ICD-10-CM | POA: Diagnosis not present

## 2019-10-17 DIAGNOSIS — M5136 Other intervertebral disc degeneration, lumbar region: Secondary | ICD-10-CM | POA: Diagnosis not present

## 2019-10-17 DIAGNOSIS — G894 Chronic pain syndrome: Secondary | ICD-10-CM | POA: Diagnosis not present

## 2019-10-25 DIAGNOSIS — E1122 Type 2 diabetes mellitus with diabetic chronic kidney disease: Secondary | ICD-10-CM | POA: Diagnosis not present

## 2019-10-25 DIAGNOSIS — N400 Enlarged prostate without lower urinary tract symptoms: Secondary | ICD-10-CM | POA: Diagnosis not present

## 2019-10-25 DIAGNOSIS — E7849 Other hyperlipidemia: Secondary | ICD-10-CM | POA: Diagnosis not present

## 2019-10-25 DIAGNOSIS — N182 Chronic kidney disease, stage 2 (mild): Secondary | ICD-10-CM | POA: Diagnosis not present

## 2019-11-15 DIAGNOSIS — Z1389 Encounter for screening for other disorder: Secondary | ICD-10-CM | POA: Diagnosis not present

## 2019-11-15 DIAGNOSIS — G894 Chronic pain syndrome: Secondary | ICD-10-CM | POA: Diagnosis not present

## 2019-11-15 DIAGNOSIS — Z0001 Encounter for general adult medical examination with abnormal findings: Secondary | ICD-10-CM | POA: Diagnosis not present

## 2019-11-15 DIAGNOSIS — Z6827 Body mass index (BMI) 27.0-27.9, adult: Secondary | ICD-10-CM | POA: Diagnosis not present

## 2019-11-15 DIAGNOSIS — K219 Gastro-esophageal reflux disease without esophagitis: Secondary | ICD-10-CM | POA: Diagnosis not present

## 2019-11-15 DIAGNOSIS — I700 Atherosclerosis of aorta: Secondary | ICD-10-CM | POA: Diagnosis not present

## 2019-11-15 DIAGNOSIS — M1991 Primary osteoarthritis, unspecified site: Secondary | ICD-10-CM | POA: Diagnosis not present

## 2019-11-26 DIAGNOSIS — N182 Chronic kidney disease, stage 2 (mild): Secondary | ICD-10-CM | POA: Diagnosis not present

## 2019-11-26 DIAGNOSIS — E7849 Other hyperlipidemia: Secondary | ICD-10-CM | POA: Diagnosis not present

## 2019-11-26 DIAGNOSIS — E1122 Type 2 diabetes mellitus with diabetic chronic kidney disease: Secondary | ICD-10-CM | POA: Diagnosis not present

## 2019-11-26 DIAGNOSIS — N400 Enlarged prostate without lower urinary tract symptoms: Secondary | ICD-10-CM | POA: Diagnosis not present

## 2019-12-13 DIAGNOSIS — K219 Gastro-esophageal reflux disease without esophagitis: Secondary | ICD-10-CM | POA: Diagnosis not present

## 2019-12-13 DIAGNOSIS — Z6827 Body mass index (BMI) 27.0-27.9, adult: Secondary | ICD-10-CM | POA: Diagnosis not present

## 2019-12-13 DIAGNOSIS — E663 Overweight: Secondary | ICD-10-CM | POA: Diagnosis not present

## 2019-12-13 DIAGNOSIS — G894 Chronic pain syndrome: Secondary | ICD-10-CM | POA: Diagnosis not present

## 2019-12-26 DIAGNOSIS — N182 Chronic kidney disease, stage 2 (mild): Secondary | ICD-10-CM | POA: Diagnosis not present

## 2019-12-26 DIAGNOSIS — E1122 Type 2 diabetes mellitus with diabetic chronic kidney disease: Secondary | ICD-10-CM | POA: Diagnosis not present

## 2019-12-26 DIAGNOSIS — N400 Enlarged prostate without lower urinary tract symptoms: Secondary | ICD-10-CM | POA: Diagnosis not present

## 2019-12-26 DIAGNOSIS — E7849 Other hyperlipidemia: Secondary | ICD-10-CM | POA: Diagnosis not present

## 2020-01-13 DIAGNOSIS — G894 Chronic pain syndrome: Secondary | ICD-10-CM | POA: Diagnosis not present

## 2020-01-13 DIAGNOSIS — K219 Gastro-esophageal reflux disease without esophagitis: Secondary | ICD-10-CM | POA: Diagnosis not present

## 2020-01-25 DIAGNOSIS — N400 Enlarged prostate without lower urinary tract symptoms: Secondary | ICD-10-CM | POA: Diagnosis not present

## 2020-01-25 DIAGNOSIS — E7849 Other hyperlipidemia: Secondary | ICD-10-CM | POA: Diagnosis not present

## 2020-01-25 DIAGNOSIS — N182 Chronic kidney disease, stage 2 (mild): Secondary | ICD-10-CM | POA: Diagnosis not present

## 2020-01-25 DIAGNOSIS — E1122 Type 2 diabetes mellitus with diabetic chronic kidney disease: Secondary | ICD-10-CM | POA: Diagnosis not present

## 2020-02-04 ENCOUNTER — Other Ambulatory Visit (HOSPITAL_COMMUNITY): Payer: Self-pay | Admitting: Internal Medicine

## 2020-02-04 DIAGNOSIS — M25551 Pain in right hip: Secondary | ICD-10-CM

## 2020-02-04 DIAGNOSIS — M1991 Primary osteoarthritis, unspecified site: Secondary | ICD-10-CM | POA: Diagnosis not present

## 2020-02-04 DIAGNOSIS — G894 Chronic pain syndrome: Secondary | ICD-10-CM | POA: Diagnosis not present

## 2020-02-04 DIAGNOSIS — E1129 Type 2 diabetes mellitus with other diabetic kidney complication: Secondary | ICD-10-CM | POA: Diagnosis not present

## 2020-02-04 DIAGNOSIS — N400 Enlarged prostate without lower urinary tract symptoms: Secondary | ICD-10-CM | POA: Diagnosis not present

## 2020-02-25 DIAGNOSIS — N182 Chronic kidney disease, stage 2 (mild): Secondary | ICD-10-CM | POA: Diagnosis not present

## 2020-02-25 DIAGNOSIS — E7849 Other hyperlipidemia: Secondary | ICD-10-CM | POA: Diagnosis not present

## 2020-02-25 DIAGNOSIS — N400 Enlarged prostate without lower urinary tract symptoms: Secondary | ICD-10-CM | POA: Diagnosis not present

## 2020-02-25 DIAGNOSIS — E1122 Type 2 diabetes mellitus with diabetic chronic kidney disease: Secondary | ICD-10-CM | POA: Diagnosis not present

## 2020-03-06 DIAGNOSIS — M1991 Primary osteoarthritis, unspecified site: Secondary | ICD-10-CM | POA: Diagnosis not present

## 2020-03-06 DIAGNOSIS — Z6826 Body mass index (BMI) 26.0-26.9, adult: Secondary | ICD-10-CM | POA: Diagnosis not present

## 2020-03-06 DIAGNOSIS — G894 Chronic pain syndrome: Secondary | ICD-10-CM | POA: Diagnosis not present

## 2020-03-06 DIAGNOSIS — M4800 Spinal stenosis, site unspecified: Secondary | ICD-10-CM | POA: Diagnosis not present

## 2020-03-06 DIAGNOSIS — Z789 Other specified health status: Secondary | ICD-10-CM | POA: Diagnosis not present

## 2020-03-06 DIAGNOSIS — E1129 Type 2 diabetes mellitus with other diabetic kidney complication: Secondary | ICD-10-CM | POA: Diagnosis not present

## 2020-03-06 DIAGNOSIS — K219 Gastro-esophageal reflux disease without esophagitis: Secondary | ICD-10-CM | POA: Diagnosis not present

## 2020-03-27 DIAGNOSIS — N400 Enlarged prostate without lower urinary tract symptoms: Secondary | ICD-10-CM | POA: Diagnosis not present

## 2020-03-27 DIAGNOSIS — N182 Chronic kidney disease, stage 2 (mild): Secondary | ICD-10-CM | POA: Diagnosis not present

## 2020-03-27 DIAGNOSIS — E7849 Other hyperlipidemia: Secondary | ICD-10-CM | POA: Diagnosis not present

## 2020-03-27 DIAGNOSIS — E1122 Type 2 diabetes mellitus with diabetic chronic kidney disease: Secondary | ICD-10-CM | POA: Diagnosis not present

## 2020-04-25 DIAGNOSIS — E1122 Type 2 diabetes mellitus with diabetic chronic kidney disease: Secondary | ICD-10-CM | POA: Diagnosis not present

## 2020-04-25 DIAGNOSIS — E7849 Other hyperlipidemia: Secondary | ICD-10-CM | POA: Diagnosis not present

## 2020-04-25 DIAGNOSIS — N182 Chronic kidney disease, stage 2 (mild): Secondary | ICD-10-CM | POA: Diagnosis not present

## 2020-04-25 DIAGNOSIS — N400 Enlarged prostate without lower urinary tract symptoms: Secondary | ICD-10-CM | POA: Diagnosis not present

## 2020-04-27 DIAGNOSIS — M5184 Other intervertebral disc disorders, thoracic region: Secondary | ICD-10-CM | POA: Diagnosis not present

## 2020-04-27 DIAGNOSIS — M438X6 Other specified deforming dorsopathies, lumbar region: Secondary | ICD-10-CM | POA: Diagnosis not present

## 2020-04-27 DIAGNOSIS — M5124 Other intervertebral disc displacement, thoracic region: Secondary | ICD-10-CM | POA: Diagnosis not present

## 2020-04-27 DIAGNOSIS — M4807 Spinal stenosis, lumbosacral region: Secondary | ICD-10-CM | POA: Diagnosis not present

## 2020-04-27 DIAGNOSIS — M438X5 Other specified deforming dorsopathies, thoracolumbar region: Secondary | ICD-10-CM | POA: Diagnosis not present

## 2020-04-27 DIAGNOSIS — M5127 Other intervertebral disc displacement, lumbosacral region: Secondary | ICD-10-CM | POA: Diagnosis not present

## 2020-04-27 DIAGNOSIS — M419 Scoliosis, unspecified: Secondary | ICD-10-CM | POA: Diagnosis not present

## 2020-04-27 DIAGNOSIS — M48061 Spinal stenosis, lumbar region without neurogenic claudication: Secondary | ICD-10-CM | POA: Diagnosis not present

## 2020-04-27 DIAGNOSIS — M5186 Other intervertebral disc disorders, lumbar region: Secondary | ICD-10-CM | POA: Diagnosis not present

## 2020-04-27 DIAGNOSIS — M5126 Other intervertebral disc displacement, lumbar region: Secondary | ICD-10-CM | POA: Diagnosis not present

## 2020-04-27 DIAGNOSIS — M47817 Spondylosis without myelopathy or radiculopathy, lumbosacral region: Secondary | ICD-10-CM | POA: Diagnosis not present

## 2020-04-27 DIAGNOSIS — Z981 Arthrodesis status: Secondary | ICD-10-CM | POA: Diagnosis not present

## 2020-04-27 DIAGNOSIS — M4805 Spinal stenosis, thoracolumbar region: Secondary | ICD-10-CM | POA: Diagnosis not present

## 2020-04-27 DIAGNOSIS — M4804 Spinal stenosis, thoracic region: Secondary | ICD-10-CM | POA: Diagnosis not present

## 2020-05-25 DIAGNOSIS — E1122 Type 2 diabetes mellitus with diabetic chronic kidney disease: Secondary | ICD-10-CM | POA: Diagnosis not present

## 2020-05-25 DIAGNOSIS — M4326 Fusion of spine, lumbar region: Secondary | ICD-10-CM | POA: Diagnosis not present

## 2020-05-25 DIAGNOSIS — M9973 Connective tissue and disc stenosis of intervertebral foramina of lumbar region: Secondary | ICD-10-CM | POA: Diagnosis not present

## 2020-05-25 DIAGNOSIS — M5124 Other intervertebral disc displacement, thoracic region: Secondary | ICD-10-CM | POA: Diagnosis not present

## 2020-05-25 DIAGNOSIS — E7849 Other hyperlipidemia: Secondary | ICD-10-CM | POA: Diagnosis not present

## 2020-05-25 DIAGNOSIS — N182 Chronic kidney disease, stage 2 (mild): Secondary | ICD-10-CM | POA: Diagnosis not present

## 2020-05-25 DIAGNOSIS — M47816 Spondylosis without myelopathy or radiculopathy, lumbar region: Secondary | ICD-10-CM | POA: Diagnosis not present

## 2020-05-25 DIAGNOSIS — N400 Enlarged prostate without lower urinary tract symptoms: Secondary | ICD-10-CM | POA: Diagnosis not present

## 2020-05-25 DIAGNOSIS — M5136 Other intervertebral disc degeneration, lumbar region: Secondary | ICD-10-CM | POA: Diagnosis not present

## 2020-05-25 DIAGNOSIS — M4316 Spondylolisthesis, lumbar region: Secondary | ICD-10-CM | POA: Diagnosis not present

## 2020-06-12 DIAGNOSIS — Z1331 Encounter for screening for depression: Secondary | ICD-10-CM | POA: Diagnosis not present

## 2020-06-12 DIAGNOSIS — E119 Type 2 diabetes mellitus without complications: Secondary | ICD-10-CM | POA: Diagnosis not present

## 2020-06-12 DIAGNOSIS — N182 Chronic kidney disease, stage 2 (mild): Secondary | ICD-10-CM | POA: Diagnosis not present

## 2020-06-12 DIAGNOSIS — G894 Chronic pain syndrome: Secondary | ICD-10-CM | POA: Diagnosis not present

## 2020-06-12 DIAGNOSIS — Z6825 Body mass index (BMI) 25.0-25.9, adult: Secondary | ICD-10-CM | POA: Diagnosis not present

## 2020-06-12 DIAGNOSIS — I700 Atherosclerosis of aorta: Secondary | ICD-10-CM | POA: Diagnosis not present

## 2020-06-12 DIAGNOSIS — N400 Enlarged prostate without lower urinary tract symptoms: Secondary | ICD-10-CM | POA: Diagnosis not present

## 2020-06-24 DIAGNOSIS — E1122 Type 2 diabetes mellitus with diabetic chronic kidney disease: Secondary | ICD-10-CM | POA: Diagnosis not present

## 2020-06-24 DIAGNOSIS — E7849 Other hyperlipidemia: Secondary | ICD-10-CM | POA: Diagnosis not present

## 2020-06-24 DIAGNOSIS — N182 Chronic kidney disease, stage 2 (mild): Secondary | ICD-10-CM | POA: Diagnosis not present

## 2020-06-24 DIAGNOSIS — N400 Enlarged prostate without lower urinary tract symptoms: Secondary | ICD-10-CM | POA: Diagnosis not present

## 2020-06-25 DIAGNOSIS — M47816 Spondylosis without myelopathy or radiculopathy, lumbar region: Secondary | ICD-10-CM | POA: Diagnosis not present

## 2020-06-25 DIAGNOSIS — M961 Postlaminectomy syndrome, not elsewhere classified: Secondary | ICD-10-CM | POA: Diagnosis not present

## 2020-06-25 DIAGNOSIS — M461 Sacroiliitis, not elsewhere classified: Secondary | ICD-10-CM | POA: Diagnosis not present

## 2020-06-30 DIAGNOSIS — I10 Essential (primary) hypertension: Secondary | ICD-10-CM | POA: Diagnosis not present

## 2020-06-30 DIAGNOSIS — M47816 Spondylosis without myelopathy or radiculopathy, lumbar region: Secondary | ICD-10-CM | POA: Diagnosis not present

## 2020-06-30 DIAGNOSIS — Z6826 Body mass index (BMI) 26.0-26.9, adult: Secondary | ICD-10-CM | POA: Diagnosis not present

## 2020-07-06 DIAGNOSIS — G894 Chronic pain syndrome: Secondary | ICD-10-CM | POA: Diagnosis not present

## 2020-07-15 DIAGNOSIS — M47816 Spondylosis without myelopathy or radiculopathy, lumbar region: Secondary | ICD-10-CM | POA: Diagnosis not present

## 2020-07-17 IMAGING — MR MR LUMBAR SPINE WO/W CM
4 of 7 series · 12 of 48 positions shown · IV contrast (gadavist)
Comparison: 04/09/2009

CLINICAL DATA: Back pain when standing and walking. Pain in both
legs. History of lumbar surgery.

EXAM:
MRI LUMBAR SPINE WITHOUT AND WITH CONTRAST
TECHNIQUE: Multiplanar and multiecho pulse sequences of the lumbar spine were
obtained without and with intravenous contrast.
CONTRAST:  8mL GADAVIST GADOBUTROL 1 MMOL/ML IV SOLN

[Series 3: T1 · sagittal · 4.0mm · 0.35mm/px · 3 of 19 slices shown (1 of 2)]
[im 1/19]
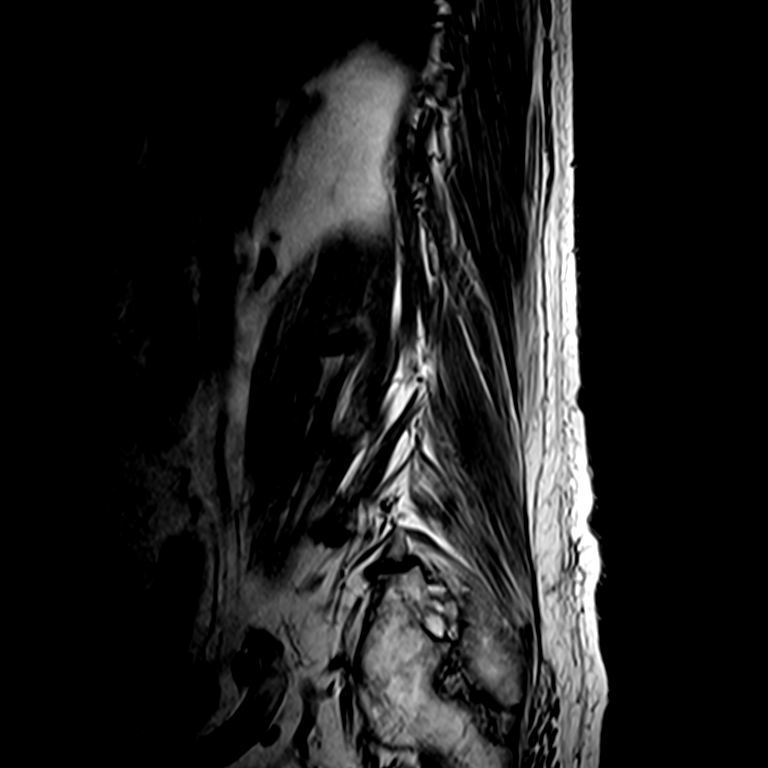
[im 13/19]
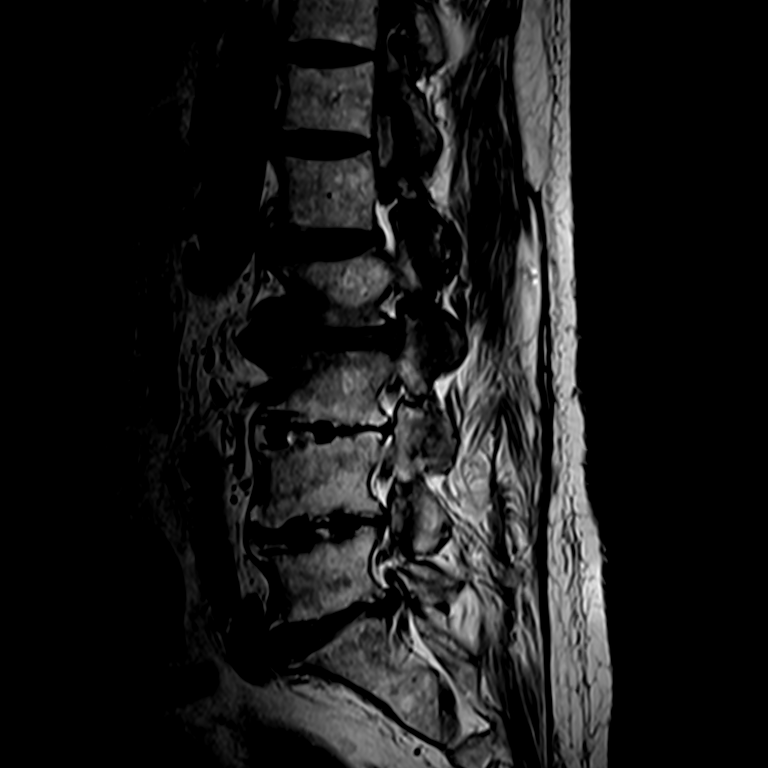
[im 19/19]
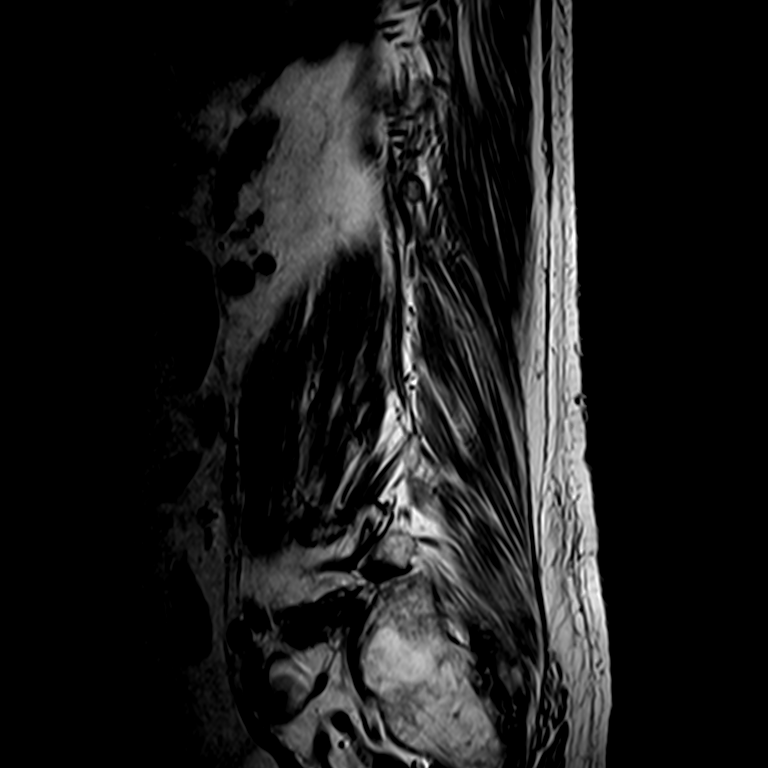

[Series 5: T2 · axial · 4.0mm · 0.31mm/px · z∈[-81,+73]mm · 3 of 42 slices shown (1 of 2)]
[im 5/42]
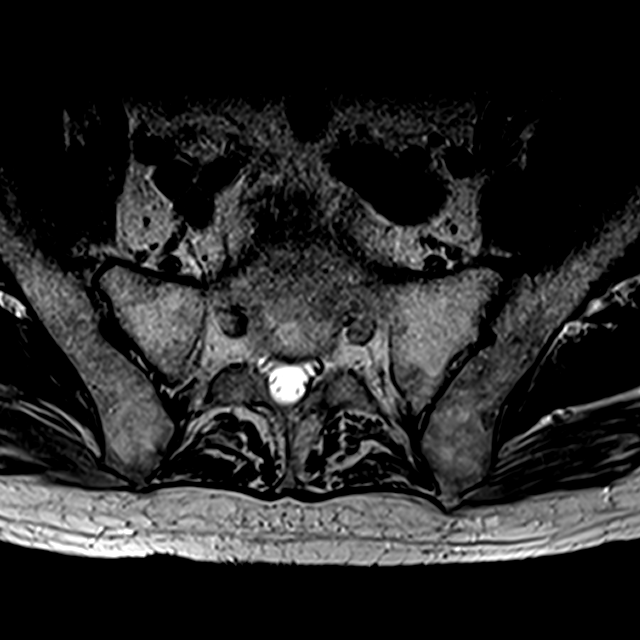
[im 23/42]
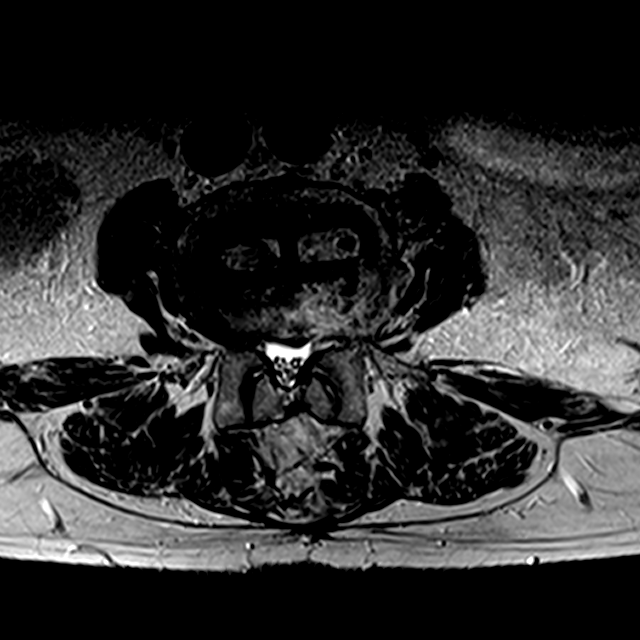
[im 37/42]
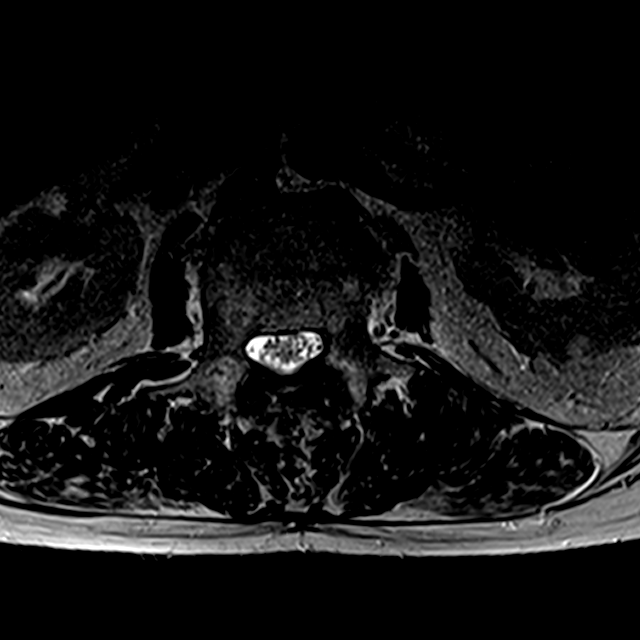

[Series 6: T1 · axial · 4.0mm · 0.30mm/px · z∈[-81,+73]mm · 3 of 42 slices shown (2 of 2)]
[im 5/42]
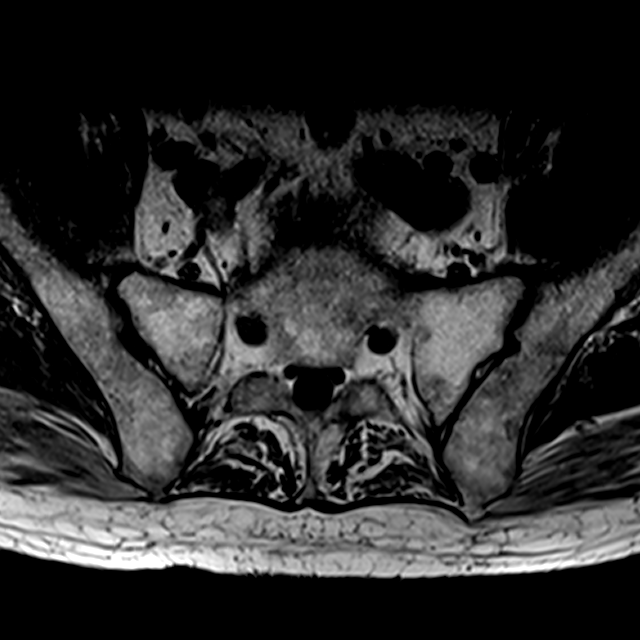
[im 23/42]
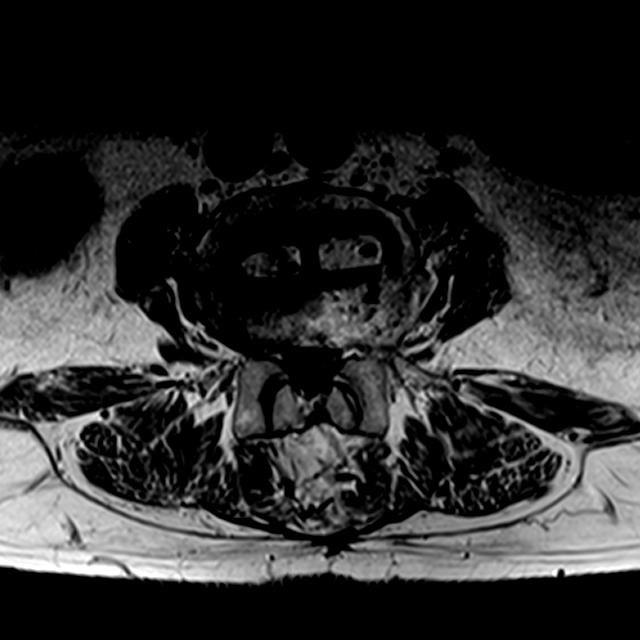
[im 37/42]
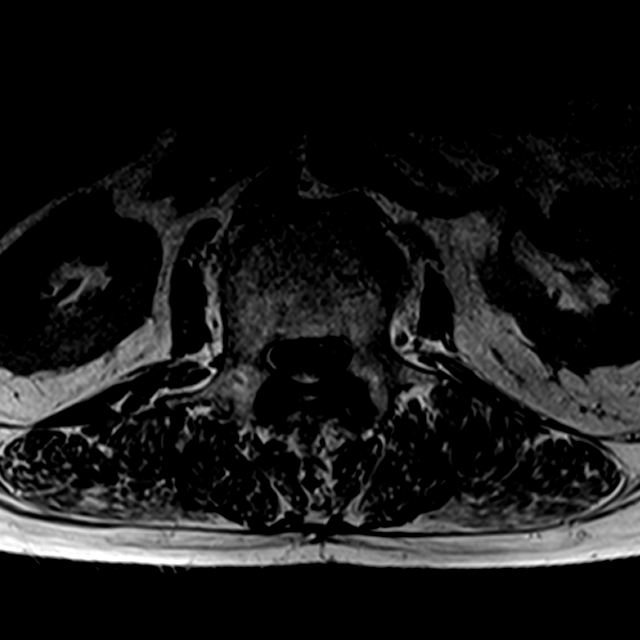

[Series 7: T2 · sagittal · 4.0mm · 0.61mm/px · 3 of 19 slices shown (2 of 2)]
[im 1/19]
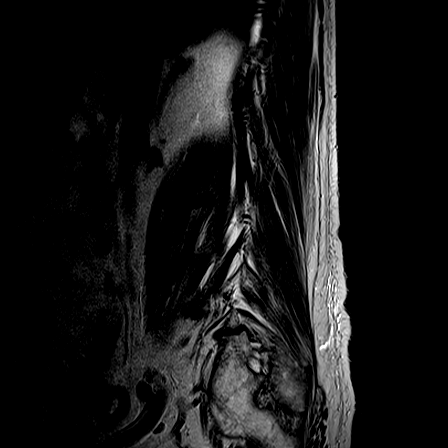
[im 10/19]
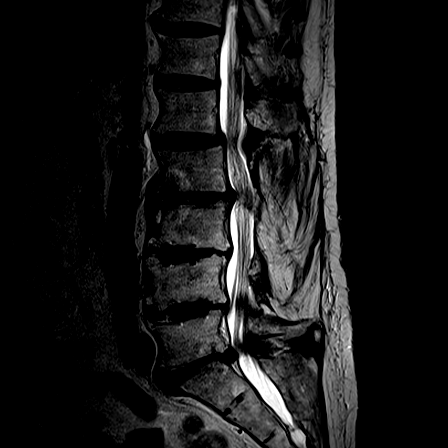
[im 19/19]
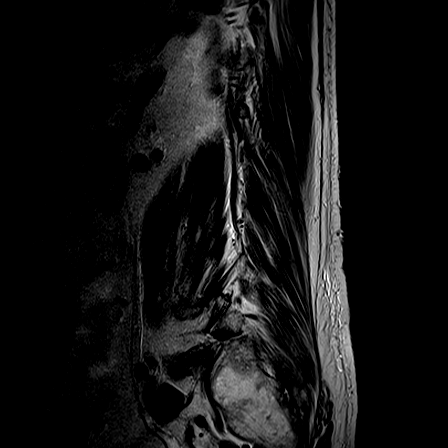

[12 of 48 positions shown; findings below may reference images not displayed]

FINDINGS: Segmentation: The lowest lumbar type non-rib-bearing vertebra is
labeled as L5.

Alignment: 4 mm grade 1 anterolisthesis at L1-2. 3 mm degenerative
retrolisthesis at L3-4 and L4-5.

Vertebrae: Interbody fusion at L3-4 and L4-5. Disc desiccation at
all other levels with loss of disc height at L2-3 and L5-S1.
Primarily type 1 degenerative endplate findings anteriorly at L2-3.
Degenerative facet edema bilaterally at L1-2 and on the right at
L5-S1. Mildly congenitally short pedicles in the lumbar spine.
Posterior decompression from L3 through L5.

Conus medullaris and cauda equina: Conus extends to the L1 level.
Conus and cauda equina appear normal. No abnormal enhancement along
the cauda equina.

Paraspinal and other soft tissues: Bilateral renal fluid signal
intensity lesions favor cysts.

Disc levels:

L1-2: Moderate central narrowing of the thecal sac with mild
bilateral foraminal stenosis and mild bilateral subarticular lateral
recess stenosis due to disc bulge, facet arthropathy, and a small
right synovial cyst in the neural foramen. Impingement significantly
worsened from prior.

L2-3: Moderate central narrowing of the thecal sac and mild left
foraminal stenosis with moderate left and mild right subarticular
lateral recess stenosis due to disc bulge, facet arthropathy, and
intervertebral spurring. The findings are worsened compared to
prior.

L3-4: Mild bilateral subarticular lateral recess stenosis and mild
bilateral foraminal stenosis due to intervertebral and facet
spurring. Similar appearance to the prior exam.

L4-5: Mild to moderate right and mild left foraminal stenosis along
with mild central narrowing of the thecal sac and borderline
bilateral subarticular lateral recess stenosis due to intervertebral
and facet spurring. Impingement at this level is improved compared
to the prior exam.

L5-S1: Moderate bilateral foraminal stenosis with moderate bilateral
subarticular lateral recess stenosis and mild central narrowing of
the thecal sac due to disc bulge, intervertebral spurring, and facet
arthropathy, worsened from the prior exam.
IMPRESSION: 1. Lumbar spondylosis and degenerative disc disease, causing
moderate impingement at L1-2, L2-3, and L5-S1; mild to moderate
impingement at L4-5; and mild impingement at L3-4, as detailed
above. Impingement is worsened at L1-2, L2-3, and L5-S1, but
improved at L4-5, compared to 04/09/2009.
2. Posterior decompression and interbody fusion from L3 through L5.
3. Degenerative facet edema bilaterally at L1-2 and on the right at
L5-S1.

## 2020-08-05 DIAGNOSIS — M461 Sacroiliitis, not elsewhere classified: Secondary | ICD-10-CM | POA: Diagnosis not present

## 2020-08-10 DIAGNOSIS — G894 Chronic pain syndrome: Secondary | ICD-10-CM | POA: Diagnosis not present

## 2020-08-10 DIAGNOSIS — M48061 Spinal stenosis, lumbar region without neurogenic claudication: Secondary | ICD-10-CM | POA: Diagnosis not present

## 2020-08-10 DIAGNOSIS — G9519 Other vascular myelopathies: Secondary | ICD-10-CM | POA: Diagnosis not present

## 2020-08-25 DIAGNOSIS — N400 Enlarged prostate without lower urinary tract symptoms: Secondary | ICD-10-CM | POA: Diagnosis not present

## 2020-08-25 DIAGNOSIS — E7849 Other hyperlipidemia: Secondary | ICD-10-CM | POA: Diagnosis not present

## 2020-08-25 DIAGNOSIS — E1122 Type 2 diabetes mellitus with diabetic chronic kidney disease: Secondary | ICD-10-CM | POA: Diagnosis not present

## 2020-08-25 DIAGNOSIS — N182 Chronic kidney disease, stage 2 (mild): Secondary | ICD-10-CM | POA: Diagnosis not present

## 2020-09-04 DIAGNOSIS — M461 Sacroiliitis, not elsewhere classified: Secondary | ICD-10-CM | POA: Diagnosis not present

## 2020-09-10 DIAGNOSIS — Z6824 Body mass index (BMI) 24.0-24.9, adult: Secondary | ICD-10-CM | POA: Diagnosis not present

## 2020-09-10 DIAGNOSIS — N400 Enlarged prostate without lower urinary tract symptoms: Secondary | ICD-10-CM | POA: Diagnosis not present

## 2020-09-10 DIAGNOSIS — G894 Chronic pain syndrome: Secondary | ICD-10-CM | POA: Diagnosis not present

## 2020-09-10 DIAGNOSIS — E1129 Type 2 diabetes mellitus with other diabetic kidney complication: Secondary | ICD-10-CM | POA: Diagnosis not present

## 2020-10-02 DIAGNOSIS — G894 Chronic pain syndrome: Secondary | ICD-10-CM | POA: Diagnosis not present

## 2020-10-02 DIAGNOSIS — M48061 Spinal stenosis, lumbar region without neurogenic claudication: Secondary | ICD-10-CM | POA: Diagnosis not present

## 2020-10-25 DIAGNOSIS — E1122 Type 2 diabetes mellitus with diabetic chronic kidney disease: Secondary | ICD-10-CM | POA: Diagnosis not present

## 2020-10-25 DIAGNOSIS — E782 Mixed hyperlipidemia: Secondary | ICD-10-CM | POA: Diagnosis not present

## 2020-10-25 DIAGNOSIS — N182 Chronic kidney disease, stage 2 (mild): Secondary | ICD-10-CM | POA: Diagnosis not present

## 2020-11-06 DIAGNOSIS — E1129 Type 2 diabetes mellitus with other diabetic kidney complication: Secondary | ICD-10-CM | POA: Diagnosis not present

## 2020-11-06 DIAGNOSIS — G894 Chronic pain syndrome: Secondary | ICD-10-CM | POA: Diagnosis not present

## 2020-11-11 DIAGNOSIS — M461 Sacroiliitis, not elsewhere classified: Secondary | ICD-10-CM | POA: Diagnosis not present

## 2020-11-25 DIAGNOSIS — M461 Sacroiliitis, not elsewhere classified: Secondary | ICD-10-CM | POA: Diagnosis not present

## 2020-12-04 DIAGNOSIS — E663 Overweight: Secondary | ICD-10-CM | POA: Diagnosis not present

## 2020-12-04 DIAGNOSIS — Z79891 Long term (current) use of opiate analgesic: Secondary | ICD-10-CM | POA: Diagnosis not present

## 2020-12-04 DIAGNOSIS — M1991 Primary osteoarthritis, unspecified site: Secondary | ICD-10-CM | POA: Diagnosis not present

## 2020-12-04 DIAGNOSIS — Z0001 Encounter for general adult medical examination with abnormal findings: Secondary | ICD-10-CM | POA: Diagnosis not present

## 2020-12-04 DIAGNOSIS — Z1331 Encounter for screening for depression: Secondary | ICD-10-CM | POA: Diagnosis not present

## 2020-12-04 DIAGNOSIS — E119 Type 2 diabetes mellitus without complications: Secondary | ICD-10-CM | POA: Diagnosis not present

## 2020-12-04 DIAGNOSIS — G894 Chronic pain syndrome: Secondary | ICD-10-CM | POA: Diagnosis not present

## 2020-12-04 DIAGNOSIS — Z23 Encounter for immunization: Secondary | ICD-10-CM | POA: Diagnosis not present

## 2020-12-04 DIAGNOSIS — Z6826 Body mass index (BMI) 26.0-26.9, adult: Secondary | ICD-10-CM | POA: Diagnosis not present

## 2020-12-25 DIAGNOSIS — N182 Chronic kidney disease, stage 2 (mild): Secondary | ICD-10-CM | POA: Diagnosis not present

## 2020-12-25 DIAGNOSIS — E782 Mixed hyperlipidemia: Secondary | ICD-10-CM | POA: Diagnosis not present

## 2020-12-25 DIAGNOSIS — E1122 Type 2 diabetes mellitus with diabetic chronic kidney disease: Secondary | ICD-10-CM | POA: Diagnosis not present

## 2021-01-05 DIAGNOSIS — M1991 Primary osteoarthritis, unspecified site: Secondary | ICD-10-CM | POA: Diagnosis not present

## 2021-01-05 DIAGNOSIS — E1129 Type 2 diabetes mellitus with other diabetic kidney complication: Secondary | ICD-10-CM | POA: Diagnosis not present

## 2021-01-25 DIAGNOSIS — N182 Chronic kidney disease, stage 2 (mild): Secondary | ICD-10-CM | POA: Diagnosis not present

## 2021-01-25 DIAGNOSIS — E1122 Type 2 diabetes mellitus with diabetic chronic kidney disease: Secondary | ICD-10-CM | POA: Diagnosis not present

## 2021-01-25 DIAGNOSIS — E782 Mixed hyperlipidemia: Secondary | ICD-10-CM | POA: Diagnosis not present

## 2021-02-01 DIAGNOSIS — M961 Postlaminectomy syndrome, not elsewhere classified: Secondary | ICD-10-CM | POA: Diagnosis not present

## 2021-02-03 DIAGNOSIS — E1122 Type 2 diabetes mellitus with diabetic chronic kidney disease: Secondary | ICD-10-CM | POA: Diagnosis not present

## 2021-02-03 DIAGNOSIS — G894 Chronic pain syndrome: Secondary | ICD-10-CM | POA: Diagnosis not present

## 2021-02-10 DIAGNOSIS — F0634 Mood disorder due to known physiological condition with mixed features: Secondary | ICD-10-CM | POA: Diagnosis not present

## 2021-02-11 DIAGNOSIS — F0634 Mood disorder due to known physiological condition with mixed features: Secondary | ICD-10-CM | POA: Diagnosis not present

## 2021-02-12 DIAGNOSIS — F0634 Mood disorder due to known physiological condition with mixed features: Secondary | ICD-10-CM | POA: Diagnosis not present

## 2021-02-13 DIAGNOSIS — F0634 Mood disorder due to known physiological condition with mixed features: Secondary | ICD-10-CM | POA: Diagnosis not present

## 2021-02-24 DIAGNOSIS — N182 Chronic kidney disease, stage 2 (mild): Secondary | ICD-10-CM | POA: Diagnosis not present

## 2021-02-24 DIAGNOSIS — E782 Mixed hyperlipidemia: Secondary | ICD-10-CM | POA: Diagnosis not present

## 2021-02-24 DIAGNOSIS — E1122 Type 2 diabetes mellitus with diabetic chronic kidney disease: Secondary | ICD-10-CM | POA: Diagnosis not present

## 2021-03-08 DIAGNOSIS — G894 Chronic pain syndrome: Secondary | ICD-10-CM | POA: Diagnosis not present

## 2021-03-08 DIAGNOSIS — Z6826 Body mass index (BMI) 26.0-26.9, adult: Secondary | ICD-10-CM | POA: Diagnosis not present

## 2021-03-08 DIAGNOSIS — E1122 Type 2 diabetes mellitus with diabetic chronic kidney disease: Secondary | ICD-10-CM | POA: Diagnosis not present

## 2021-03-08 DIAGNOSIS — E663 Overweight: Secondary | ICD-10-CM | POA: Diagnosis not present

## 2021-03-08 DIAGNOSIS — M1991 Primary osteoarthritis, unspecified site: Secondary | ICD-10-CM | POA: Diagnosis not present

## 2021-03-09 DIAGNOSIS — M961 Postlaminectomy syndrome, not elsewhere classified: Secondary | ICD-10-CM | POA: Diagnosis not present

## 2021-03-26 DIAGNOSIS — E782 Mixed hyperlipidemia: Secondary | ICD-10-CM | POA: Diagnosis not present

## 2021-03-26 DIAGNOSIS — N182 Chronic kidney disease, stage 2 (mild): Secondary | ICD-10-CM | POA: Diagnosis not present

## 2021-04-01 DIAGNOSIS — Z6827 Body mass index (BMI) 27.0-27.9, adult: Secondary | ICD-10-CM | POA: Diagnosis not present

## 2021-04-01 DIAGNOSIS — M961 Postlaminectomy syndrome, not elsewhere classified: Secondary | ICD-10-CM | POA: Diagnosis not present

## 2021-04-01 DIAGNOSIS — M461 Sacroiliitis, not elsewhere classified: Secondary | ICD-10-CM | POA: Diagnosis not present

## 2021-04-01 DIAGNOSIS — R030 Elevated blood-pressure reading, without diagnosis of hypertension: Secondary | ICD-10-CM | POA: Diagnosis not present

## 2021-04-08 DIAGNOSIS — M1991 Primary osteoarthritis, unspecified site: Secondary | ICD-10-CM | POA: Diagnosis not present

## 2021-04-08 DIAGNOSIS — M48061 Spinal stenosis, lumbar region without neurogenic claudication: Secondary | ICD-10-CM | POA: Diagnosis not present

## 2021-04-09 DIAGNOSIS — M5380 Other specified dorsopathies, site unspecified: Secondary | ICD-10-CM | POA: Diagnosis not present

## 2021-04-09 DIAGNOSIS — M961 Postlaminectomy syndrome, not elsewhere classified: Secondary | ICD-10-CM | POA: Diagnosis not present

## 2021-04-27 DIAGNOSIS — E1122 Type 2 diabetes mellitus with diabetic chronic kidney disease: Secondary | ICD-10-CM | POA: Diagnosis not present

## 2021-04-27 DIAGNOSIS — N182 Chronic kidney disease, stage 2 (mild): Secondary | ICD-10-CM | POA: Diagnosis not present

## 2021-04-27 DIAGNOSIS — E7849 Other hyperlipidemia: Secondary | ICD-10-CM | POA: Diagnosis not present

## 2021-05-10 DIAGNOSIS — G894 Chronic pain syndrome: Secondary | ICD-10-CM | POA: Diagnosis not present

## 2021-05-10 DIAGNOSIS — M48061 Spinal stenosis, lumbar region without neurogenic claudication: Secondary | ICD-10-CM | POA: Diagnosis not present

## 2021-05-10 DIAGNOSIS — M1991 Primary osteoarthritis, unspecified site: Secondary | ICD-10-CM | POA: Diagnosis not present

## 2021-05-10 DIAGNOSIS — E1122 Type 2 diabetes mellitus with diabetic chronic kidney disease: Secondary | ICD-10-CM | POA: Diagnosis not present

## 2021-05-14 DIAGNOSIS — H6122 Impacted cerumen, left ear: Secondary | ICD-10-CM | POA: Diagnosis not present

## 2021-05-14 DIAGNOSIS — H7292 Unspecified perforation of tympanic membrane, left ear: Secondary | ICD-10-CM | POA: Diagnosis not present

## 2021-05-14 DIAGNOSIS — Z9889 Other specified postprocedural states: Secondary | ICD-10-CM | POA: Diagnosis not present

## 2021-05-14 DIAGNOSIS — H938X2 Other specified disorders of left ear: Secondary | ICD-10-CM | POA: Diagnosis not present

## 2021-05-14 DIAGNOSIS — H9212 Otorrhea, left ear: Secondary | ICD-10-CM | POA: Diagnosis not present

## 2021-05-14 DIAGNOSIS — Z9622 Myringotomy tube(s) status: Secondary | ICD-10-CM | POA: Diagnosis not present

## 2021-05-14 DIAGNOSIS — Z9089 Acquired absence of other organs: Secondary | ICD-10-CM | POA: Diagnosis not present

## 2021-05-14 DIAGNOSIS — H6983 Other specified disorders of Eustachian tube, bilateral: Secondary | ICD-10-CM | POA: Diagnosis not present

## 2021-05-14 DIAGNOSIS — H90A21 Sensorineural hearing loss, unilateral, right ear, with restricted hearing on the contralateral side: Secondary | ICD-10-CM | POA: Diagnosis not present

## 2021-05-14 DIAGNOSIS — H9312 Tinnitus, left ear: Secondary | ICD-10-CM | POA: Diagnosis not present

## 2021-05-14 DIAGNOSIS — H90A32 Mixed conductive and sensorineural hearing loss, unilateral, left ear with restricted hearing on the contralateral side: Secondary | ICD-10-CM | POA: Diagnosis not present

## 2021-05-14 DIAGNOSIS — F17210 Nicotine dependence, cigarettes, uncomplicated: Secondary | ICD-10-CM | POA: Diagnosis not present

## 2021-06-07 DIAGNOSIS — E1122 Type 2 diabetes mellitus with diabetic chronic kidney disease: Secondary | ICD-10-CM | POA: Diagnosis not present

## 2021-06-07 DIAGNOSIS — Z6825 Body mass index (BMI) 25.0-25.9, adult: Secondary | ICD-10-CM | POA: Diagnosis not present

## 2021-06-07 DIAGNOSIS — E663 Overweight: Secondary | ICD-10-CM | POA: Diagnosis not present

## 2021-06-07 DIAGNOSIS — M48061 Spinal stenosis, lumbar region without neurogenic claudication: Secondary | ICD-10-CM | POA: Diagnosis not present

## 2021-06-07 DIAGNOSIS — N183 Chronic kidney disease, stage 3 unspecified: Secondary | ICD-10-CM | POA: Diagnosis not present

## 2021-06-07 DIAGNOSIS — M1991 Primary osteoarthritis, unspecified site: Secondary | ICD-10-CM | POA: Diagnosis not present

## 2021-06-07 DIAGNOSIS — G894 Chronic pain syndrome: Secondary | ICD-10-CM | POA: Diagnosis not present

## 2021-06-25 DIAGNOSIS — E782 Mixed hyperlipidemia: Secondary | ICD-10-CM | POA: Diagnosis not present

## 2021-06-25 DIAGNOSIS — E1122 Type 2 diabetes mellitus with diabetic chronic kidney disease: Secondary | ICD-10-CM | POA: Diagnosis not present

## 2021-06-25 DIAGNOSIS — N182 Chronic kidney disease, stage 2 (mild): Secondary | ICD-10-CM | POA: Diagnosis not present

## 2021-07-16 DIAGNOSIS — M1991 Primary osteoarthritis, unspecified site: Secondary | ICD-10-CM | POA: Diagnosis not present

## 2021-07-16 DIAGNOSIS — G894 Chronic pain syndrome: Secondary | ICD-10-CM | POA: Diagnosis not present

## 2021-07-16 DIAGNOSIS — E1122 Type 2 diabetes mellitus with diabetic chronic kidney disease: Secondary | ICD-10-CM | POA: Diagnosis not present

## 2021-07-25 DIAGNOSIS — E782 Mixed hyperlipidemia: Secondary | ICD-10-CM | POA: Diagnosis not present

## 2021-07-25 DIAGNOSIS — E1122 Type 2 diabetes mellitus with diabetic chronic kidney disease: Secondary | ICD-10-CM | POA: Diagnosis not present

## 2021-07-25 DIAGNOSIS — N182 Chronic kidney disease, stage 2 (mild): Secondary | ICD-10-CM | POA: Diagnosis not present

## 2021-08-06 DIAGNOSIS — N183 Chronic kidney disease, stage 3 unspecified: Secondary | ICD-10-CM | POA: Diagnosis not present

## 2021-08-06 DIAGNOSIS — M1991 Primary osteoarthritis, unspecified site: Secondary | ICD-10-CM | POA: Diagnosis not present

## 2021-08-06 DIAGNOSIS — E1122 Type 2 diabetes mellitus with diabetic chronic kidney disease: Secondary | ICD-10-CM | POA: Diagnosis not present

## 2021-08-06 DIAGNOSIS — M48061 Spinal stenosis, lumbar region without neurogenic claudication: Secondary | ICD-10-CM | POA: Diagnosis not present

## 2021-08-25 DIAGNOSIS — E1122 Type 2 diabetes mellitus with diabetic chronic kidney disease: Secondary | ICD-10-CM | POA: Diagnosis not present

## 2021-08-25 DIAGNOSIS — N182 Chronic kidney disease, stage 2 (mild): Secondary | ICD-10-CM | POA: Diagnosis not present

## 2021-08-25 DIAGNOSIS — E782 Mixed hyperlipidemia: Secondary | ICD-10-CM | POA: Diagnosis not present

## 2021-09-23 DIAGNOSIS — G894 Chronic pain syndrome: Secondary | ICD-10-CM | POA: Diagnosis not present

## 2021-09-23 DIAGNOSIS — M1991 Primary osteoarthritis, unspecified site: Secondary | ICD-10-CM | POA: Diagnosis not present

## 2021-09-23 DIAGNOSIS — N400 Enlarged prostate without lower urinary tract symptoms: Secondary | ICD-10-CM | POA: Diagnosis not present

## 2021-09-23 DIAGNOSIS — E1122 Type 2 diabetes mellitus with diabetic chronic kidney disease: Secondary | ICD-10-CM | POA: Diagnosis not present

## 2021-11-08 DIAGNOSIS — M961 Postlaminectomy syndrome, not elsewhere classified: Secondary | ICD-10-CM | POA: Diagnosis not present

## 2021-11-08 DIAGNOSIS — Z6828 Body mass index (BMI) 28.0-28.9, adult: Secondary | ICD-10-CM | POA: Diagnosis not present

## 2021-11-09 ENCOUNTER — Other Ambulatory Visit (HOSPITAL_COMMUNITY): Payer: Self-pay | Admitting: Neurosurgery

## 2021-11-09 ENCOUNTER — Other Ambulatory Visit: Payer: Self-pay | Admitting: Neurosurgery

## 2021-11-09 DIAGNOSIS — M961 Postlaminectomy syndrome, not elsewhere classified: Secondary | ICD-10-CM

## 2021-11-12 DIAGNOSIS — H6993 Unspecified Eustachian tube disorder, bilateral: Secondary | ICD-10-CM | POA: Diagnosis not present

## 2021-11-12 DIAGNOSIS — H90A32 Mixed conductive and sensorineural hearing loss, unilateral, left ear with restricted hearing on the contralateral side: Secondary | ICD-10-CM | POA: Diagnosis not present

## 2021-11-12 DIAGNOSIS — H9312 Tinnitus, left ear: Secondary | ICD-10-CM | POA: Diagnosis not present

## 2021-11-12 DIAGNOSIS — Z4589 Encounter for adjustment and management of other implanted devices: Secondary | ICD-10-CM | POA: Diagnosis not present

## 2021-11-12 DIAGNOSIS — H7292 Unspecified perforation of tympanic membrane, left ear: Secondary | ICD-10-CM | POA: Diagnosis not present

## 2021-11-12 DIAGNOSIS — Z9889 Other specified postprocedural states: Secondary | ICD-10-CM | POA: Diagnosis not present

## 2021-11-12 DIAGNOSIS — H6983 Other specified disorders of Eustachian tube, bilateral: Secondary | ICD-10-CM | POA: Diagnosis not present

## 2021-11-12 DIAGNOSIS — Z4582 Encounter for adjustment or removal of myringotomy device (stent) (tube): Secondary | ICD-10-CM | POA: Diagnosis not present

## 2021-11-26 ENCOUNTER — Ambulatory Visit (HOSPITAL_COMMUNITY)
Admission: RE | Admit: 2021-11-26 | Discharge: 2021-11-26 | Disposition: A | Discharge status: Home / Self Care | Payer: Medicare Other | Source: Ambulatory Visit | Attending: Neurosurgery | Admitting: Neurosurgery

## 2021-11-26 DIAGNOSIS — M961 Postlaminectomy syndrome, not elsewhere classified: Secondary | ICD-10-CM | POA: Diagnosis not present

## 2021-11-26 DIAGNOSIS — M48061 Spinal stenosis, lumbar region without neurogenic claudication: Secondary | ICD-10-CM | POA: Diagnosis not present

## 2021-11-26 MED ORDER — GADOBUTROL 1 MMOL/ML IV SOLN
8.0000 mL | Freq: Once | INTRAVENOUS | Status: AC | PRN
  Administered 2021-11-26: 8 mL via INTRAVENOUS

## 2021-12-08 ENCOUNTER — Telehealth: Payer: Self-pay | Admitting: *Deleted

## 2021-12-08 NOTE — Chronic Care Management (AMB) (Signed)
  Care Coordination  Outreach Note  12/08/2021 Name: Douglas Waller MRN: [id number] DOB: [birth year]   Care Coordination Outreach Attempts: An unsuccessful telephone outreach was attempted today to offer the patient information about available care coordination services as a benefit of their health plan.   Follow Up Plan:  Additional outreach attempts will be made to offer the patient care coordination information and services.   Encounter Outcome:  No Answer   Gwenevere Ghazi  Care Coordination Care Guide  Direct Dial: [phone]

## 2021-12-13 NOTE — Chronic Care Management (AMB) (Signed)
  Care Coordination  Outreach Note  12/13/2021 Name: Douglas Waller MRN: [id number] DOB: 1945/05/13   Care Coordination Outreach Attempts: A second unsuccessful outreach was attempted today to offer the patient with information about available care coordination services as a benefit of their health plan.     Follow Up Plan:  Additional outreach attempts will be made to offer the patient care coordination information and services.   Encounter Outcome:  No Answer  [place]  Direct Dial: [phone]

## 2021-12-15 DIAGNOSIS — G894 Chronic pain syndrome: Secondary | ICD-10-CM | POA: Diagnosis not present

## 2021-12-15 DIAGNOSIS — I700 Atherosclerosis of aorta: Secondary | ICD-10-CM | POA: Diagnosis not present

## 2021-12-15 DIAGNOSIS — Z6826 Body mass index (BMI) 26.0-26.9, adult: Secondary | ICD-10-CM | POA: Diagnosis not present

## 2021-12-15 DIAGNOSIS — Z0001 Encounter for general adult medical examination with abnormal findings: Secondary | ICD-10-CM | POA: Diagnosis not present

## 2021-12-15 DIAGNOSIS — E663 Overweight: Secondary | ICD-10-CM | POA: Diagnosis not present

## 2021-12-15 DIAGNOSIS — N183 Chronic kidney disease, stage 3 unspecified: Secondary | ICD-10-CM | POA: Diagnosis not present

## 2021-12-15 DIAGNOSIS — E1129 Type 2 diabetes mellitus with other diabetic kidney complication: Secondary | ICD-10-CM | POA: Diagnosis not present

## 2021-12-15 DIAGNOSIS — Z1331 Encounter for screening for depression: Secondary | ICD-10-CM | POA: Diagnosis not present

## 2021-12-16 DIAGNOSIS — M47816 Spondylosis without myelopathy or radiculopathy, lumbar region: Secondary | ICD-10-CM | POA: Diagnosis not present

## 2021-12-16 DIAGNOSIS — M533 Sacrococcygeal disorders, not elsewhere classified: Secondary | ICD-10-CM | POA: Diagnosis not present

## 2021-12-16 DIAGNOSIS — M48062 Spinal stenosis, lumbar region with neurogenic claudication: Secondary | ICD-10-CM | POA: Diagnosis not present

## 2021-12-16 DIAGNOSIS — Z79899 Other long term (current) drug therapy: Secondary | ICD-10-CM | POA: Diagnosis not present

## 2021-12-16 DIAGNOSIS — M961 Postlaminectomy syndrome, not elsewhere classified: Secondary | ICD-10-CM | POA: Diagnosis not present

## 2021-12-16 DIAGNOSIS — Z5181 Encounter for therapeutic drug level monitoring: Secondary | ICD-10-CM | POA: Diagnosis not present

## 2021-12-20 NOTE — Chronic Care Management (AMB) (Signed)
  Care Coordination  Outreach Note  12/20/2021 Name: Douglas Waller MRN: [id number] DOB: Jan 13, 1946   Care Coordination Outreach Attempts: A third unsuccessful outreach was attempted today to offer the patient with information about available care coordination services as a benefit of their health plan.   Follow Up Plan:  No further outreach attempts will be made at this time. We have been unable to contact the patient to offer or enroll patient in care coordination services  Encounter Outcome:  No Answer  [place]: [phone]

## 2021-12-30 DIAGNOSIS — M961 Postlaminectomy syndrome, not elsewhere classified: Secondary | ICD-10-CM | POA: Diagnosis not present

## 2021-12-30 DIAGNOSIS — M5136 Other intervertebral disc degeneration, lumbar region: Secondary | ICD-10-CM | POA: Diagnosis not present

## 2021-12-30 DIAGNOSIS — M47819 Spondylosis without myelopathy or radiculopathy, site unspecified: Secondary | ICD-10-CM | POA: Diagnosis not present

## 2021-12-30 DIAGNOSIS — M47816 Spondylosis without myelopathy or radiculopathy, lumbar region: Secondary | ICD-10-CM | POA: Diagnosis not present

## 2021-12-30 DIAGNOSIS — M48062 Spinal stenosis, lumbar region with neurogenic claudication: Secondary | ICD-10-CM | POA: Diagnosis not present

## 2022-01-25 DIAGNOSIS — N183 Chronic kidney disease, stage 3 unspecified: Secondary | ICD-10-CM | POA: Diagnosis not present

## 2022-01-25 DIAGNOSIS — E1129 Type 2 diabetes mellitus with other diabetic kidney complication: Secondary | ICD-10-CM | POA: Diagnosis not present

## 2022-01-25 DIAGNOSIS — N400 Enlarged prostate without lower urinary tract symptoms: Secondary | ICD-10-CM | POA: Diagnosis not present

## 2022-01-25 DIAGNOSIS — G894 Chronic pain syndrome: Secondary | ICD-10-CM | POA: Diagnosis not present

## 2022-01-26 DIAGNOSIS — M48062 Spinal stenosis, lumbar region with neurogenic claudication: Secondary | ICD-10-CM | POA: Diagnosis not present

## 2022-01-26 DIAGNOSIS — Z006 Encounter for examination for normal comparison and control in clinical research program: Secondary | ICD-10-CM | POA: Diagnosis not present

## 2022-01-27 DIAGNOSIS — N183 Chronic kidney disease, stage 3 unspecified: Secondary | ICD-10-CM | POA: Diagnosis not present

## 2022-01-27 DIAGNOSIS — N400 Enlarged prostate without lower urinary tract symptoms: Secondary | ICD-10-CM | POA: Diagnosis not present

## 2022-01-27 DIAGNOSIS — M5416 Radiculopathy, lumbar region: Secondary | ICD-10-CM | POA: Diagnosis not present

## 2022-01-27 DIAGNOSIS — G894 Chronic pain syndrome: Secondary | ICD-10-CM | POA: Diagnosis not present

## 2022-03-18 DIAGNOSIS — M48061 Spinal stenosis, lumbar region without neurogenic claudication: Secondary | ICD-10-CM | POA: Diagnosis not present

## 2022-03-18 DIAGNOSIS — E1129 Type 2 diabetes mellitus with other diabetic kidney complication: Secondary | ICD-10-CM | POA: Diagnosis not present

## 2022-03-18 DIAGNOSIS — G894 Chronic pain syndrome: Secondary | ICD-10-CM | POA: Diagnosis not present

## 2022-03-18 DIAGNOSIS — M5416 Radiculopathy, lumbar region: Secondary | ICD-10-CM | POA: Diagnosis not present

## 2022-04-16 ENCOUNTER — Encounter: Payer: Self-pay | Admitting: Emergency Medicine

## 2022-04-16 ENCOUNTER — Ambulatory Visit
Admission: EM | Admit: 2022-04-16 | Discharge: 2022-04-16 | Disposition: A | Discharge status: Home / Self Care | Payer: Medicare Other

## 2022-04-16 DIAGNOSIS — J208 Acute bronchitis due to other specified organisms: Secondary | ICD-10-CM | POA: Diagnosis not present

## 2022-04-16 MED ORDER — ALBUTEROL SULFATE HFA 108 (90 BASE) MCG/ACT IN AERS: 2.0000 | INHALATION_SPRAY | RESPIRATORY_TRACT | 0 refills | Status: DC | PRN

## 2022-04-16 MED ORDER — AMOXICILLIN-POT CLAVULANATE 875-125 MG PO TABS: 1.0000 | ORAL_TABLET | Freq: Two times a day (BID) | ORAL | 0 refills | Status: DC

## 2022-04-16 MED ORDER — BENZONATATE 100 MG PO CAPS: 100.0000 mg | ORAL_CAPSULE | Freq: Three times a day (TID) | ORAL | 0 refills | Status: DC | PRN

## 2022-04-16 NOTE — Discharge Instructions (Signed)
Get plenty of rest and push fluids Tessalon Perles prescribed for cough ProAir was prescribed / take as directed Augmentin was prescribed/take as directed Use medications daily for symptom relief Use OTC medications like ibuprofen or tylenol as needed fever or pain Call or go to the ED if you have any new or worsening symptoms such as fever, worsening cough, shortness of breath, chest tightness, chest pain, turning blue, changes in mental status, etc..Marland Kitchen

## 2022-04-16 NOTE — ED Triage Notes (Signed)
Cough, nasal congestion, chills, body aches, fever x 1 week.  Has been taking mucinex with some relief.

## 2022-04-16 NOTE — ED Provider Notes (Signed)
[name]   [id number] 04/16/22 Arrival Time: 0803   Chief Complaint  Patient presents with   Nasal Congestion   Fever     SUBJECTIVE: History from: patient.  Douglas Waller is a 77 y.o. male w with history of smoking  presented to the urgent care with a complaint of fever, cough, nasal congestion and bodyaches for the past 1 week.  Reports his sputum is brown. Denies sick exposure to COVID, flu or strep.  Denies recent travel.  Has tried Mucinex with no relief..  Denies any aggravating factors.  Denies previous symptoms in the past.   Denies fatigue, sinus pain, rhinorrhea, sore throat, SOB, wheezing, chest pain, nausea, changes in bowel or bladder habits.     ROS: As per HPI.  All other pertinent ROS negative.     Past Medical History:  Diagnosis Date   Back pain    Past Surgical History:  Procedure Laterality Date   BACK SURGERY     boston simulator in back     CATARACT EXTRACTION     COLONOSCOPY N/A 03/08/2016   Procedure: COLONOSCOPY;  Surgeon: Aviva Signs, MD;  Location: [facility];  Service: Gastroenterology;  Laterality: N/A;   EAR MASTOIDECTOMY W/ COCHLEAR IMPLANT W/ LANDMARK Left    INNER EAR SURGERY     No Known Allergies No current facility-administered medications on file prior to encounter.   Current Outpatient Medications on File Prior to Encounter  Medication Sig Dispense Refill   furosemide (LASIX) 20 MG tablet Take 20 mg by mouth.     morphine (MSIR) 15 MG tablet Take 15 mg by mouth every 4 (four) hours as needed for severe pain.     oxycodone (ROXICODONE) 30 MG immediate release tablet Take 30 mg by mouth every 4 (four) hours as needed for pain.  0   Tamsulosin HCl (FLOMAX) 0.4 MG CAPS Take 0.4 mg by mouth daily after supper.     Social History   Socioeconomic History   Marital status: Single    Spouse name: Not on file   Number of children: Not on file   Years of education: Not on file   Highest education level: Not on file   Occupational History   Not on file  Tobacco Use   Smoking status: Every Day    Packs/day: 1.50    Years: 57.00    Total pack years: 85.50    Types: Cigarettes   Smokeless tobacco: Never  Substance and Sexual Activity   Alcohol use: No   Drug use: No   Sexual activity: Not on file  Other Topics Concern   Not on file  Social History Narrative   Not on file   Social Determinants of Health   Financial Resource Strain: Not on file  Food Insecurity: Not on file  Transportation Needs: Not on file  Physical Activity: Not on file  Stress: Not on file  Social Connections: Not on file  Intimate Partner Violence: Not on file   Family History  Problem Relation Age of Onset   Cancer Mother    Heart disease Father     OBJECTIVE:  Vitals:   04/16/22 0807  BP: (!) 144/75  Pulse: 87  Resp: 18  Temp: 98.2 F (36.8 C)  TempSrc: Oral  SpO2: 92%     General appearance: alert; appears fatigued, but nontoxic; speaking in full sentences and tolerating own secretions HEENT: NCAT; Ears: EACs clear, TMs pearly gray; Eyes: PERRL.  EOM grossly intact.  Sinuses: nontender; Nose: nares patent without rhinorrhea, Throat: oropharynx clear, tonsils non erythematous or enlarged, uvula midline  Neck: supple without LAD Lungs: unlabored respirations, symmetrical air entry; cough: moderate; no respiratory distress; CTAB Heart: regular rate and rhythm.  Radial pulses 2+ symmetrical bilaterally Skin: warm and dry Psychological: alert and cooperative; normal mood and affect  LABS:  No results found for this or any previous visit (from the past 24 hour(s)).   ASSESSMENT & PLAN:  1. Acute bronchitis due to other specified organisms     Meds ordered this encounter  Medications   amoxicillin-clavulanate (AUGMENTIN) 875-125 MG tablet    Sig: Take 1 tablet by mouth every 12 (twelve) hours.    Dispense:  14 tablet    Refill:  0   albuterol (VENTOLIN HFA) 108 (90 Base) MCG/ACT inhaler    Sig:  Inhale 2 puffs into the lungs every 4 (four) hours as needed for wheezing or shortness of breath.    Dispense:  18 g    Refill:  0   benzonatate (TESSALON) 100 MG capsule    Sig: Take 1 capsule (100 mg total) by mouth 3 (three) times daily as needed for cough.    Dispense:  30 capsule    Refill:  0   Discharge instructions  Get plenty of rest and push fluids Tessalon Perles prescribed for cough ProAir was prescribed / take as directed Augmentin was prescribed/take as directed Use medications daily for symptom relief Use OTC medications like ibuprofen or tylenol as needed fever or pain Call or go to the ED if you have any new or worsening symptoms such as fever, worsening cough, shortness of breath, chest tightness, chest pain, turning blue, changes in mental status, etc...   Reviewed expectations re: course of current medical issues. Questions answered. Outlined signs and symptoms indicating need for more acute intervention. Patient verbalized understanding. After Visit Summary given.          Emerson Monte, [place] 04/16/22 [phone]

## 2022-05-02 DIAGNOSIS — M48061 Spinal stenosis, lumbar region without neurogenic claudication: Secondary | ICD-10-CM | POA: Diagnosis not present

## 2022-05-02 DIAGNOSIS — M5416 Radiculopathy, lumbar region: Secondary | ICD-10-CM | POA: Diagnosis not present

## 2022-05-02 DIAGNOSIS — N400 Enlarged prostate without lower urinary tract symptoms: Secondary | ICD-10-CM | POA: Diagnosis not present

## 2022-05-02 DIAGNOSIS — N1831 Chronic kidney disease, stage 3a: Secondary | ICD-10-CM | POA: Diagnosis not present

## 2022-05-02 DIAGNOSIS — Z6826 Body mass index (BMI) 26.0-26.9, adult: Secondary | ICD-10-CM | POA: Diagnosis not present

## 2022-05-02 DIAGNOSIS — N401 Enlarged prostate with lower urinary tract symptoms: Secondary | ICD-10-CM | POA: Diagnosis not present

## 2022-05-02 DIAGNOSIS — I700 Atherosclerosis of aorta: Secondary | ICD-10-CM | POA: Diagnosis not present

## 2022-05-02 DIAGNOSIS — E663 Overweight: Secondary | ICD-10-CM | POA: Diagnosis not present

## 2022-05-02 DIAGNOSIS — G894 Chronic pain syndrome: Secondary | ICD-10-CM | POA: Diagnosis not present

## 2022-05-02 DIAGNOSIS — E1122 Type 2 diabetes mellitus with diabetic chronic kidney disease: Secondary | ICD-10-CM | POA: Diagnosis not present

## 2022-05-26 DIAGNOSIS — E782 Mixed hyperlipidemia: Secondary | ICD-10-CM | POA: Diagnosis not present

## 2022-05-26 DIAGNOSIS — G894 Chronic pain syndrome: Secondary | ICD-10-CM | POA: Diagnosis not present

## 2022-05-26 DIAGNOSIS — N401 Enlarged prostate with lower urinary tract symptoms: Secondary | ICD-10-CM | POA: Diagnosis not present

## 2022-05-31 DIAGNOSIS — N183 Chronic kidney disease, stage 3 unspecified: Secondary | ICD-10-CM | POA: Diagnosis not present

## 2022-05-31 DIAGNOSIS — I700 Atherosclerosis of aorta: Secondary | ICD-10-CM | POA: Diagnosis not present

## 2022-05-31 DIAGNOSIS — G894 Chronic pain syndrome: Secondary | ICD-10-CM | POA: Diagnosis not present

## 2022-05-31 DIAGNOSIS — E1129 Type 2 diabetes mellitus with other diabetic kidney complication: Secondary | ICD-10-CM | POA: Diagnosis not present

## 2022-07-01 DIAGNOSIS — N1831 Chronic kidney disease, stage 3a: Secondary | ICD-10-CM | POA: Diagnosis not present

## 2022-07-01 DIAGNOSIS — E1129 Type 2 diabetes mellitus with other diabetic kidney complication: Secondary | ICD-10-CM | POA: Diagnosis not present

## 2022-07-01 DIAGNOSIS — N400 Enlarged prostate without lower urinary tract symptoms: Secondary | ICD-10-CM | POA: Diagnosis not present

## 2022-07-01 DIAGNOSIS — G894 Chronic pain syndrome: Secondary | ICD-10-CM | POA: Diagnosis not present

## 2022-07-25 DIAGNOSIS — E118 Type 2 diabetes mellitus with unspecified complications: Secondary | ICD-10-CM | POA: Diagnosis not present

## 2022-07-25 DIAGNOSIS — E559 Vitamin D deficiency, unspecified: Secondary | ICD-10-CM | POA: Diagnosis not present

## 2022-07-25 DIAGNOSIS — N401 Enlarged prostate with lower urinary tract symptoms: Secondary | ICD-10-CM | POA: Diagnosis not present

## 2022-07-25 DIAGNOSIS — E1122 Type 2 diabetes mellitus with diabetic chronic kidney disease: Secondary | ICD-10-CM | POA: Diagnosis not present

## 2022-07-26 DIAGNOSIS — N401 Enlarged prostate with lower urinary tract symptoms: Secondary | ICD-10-CM | POA: Diagnosis not present

## 2022-07-26 DIAGNOSIS — G894 Chronic pain syndrome: Secondary | ICD-10-CM | POA: Diagnosis not present

## 2022-07-26 DIAGNOSIS — E782 Mixed hyperlipidemia: Secondary | ICD-10-CM | POA: Diagnosis not present

## 2022-07-29 DIAGNOSIS — I700 Atherosclerosis of aorta: Secondary | ICD-10-CM | POA: Diagnosis not present

## 2022-07-29 DIAGNOSIS — G894 Chronic pain syndrome: Secondary | ICD-10-CM | POA: Diagnosis not present

## 2022-07-29 DIAGNOSIS — N183 Chronic kidney disease, stage 3 unspecified: Secondary | ICD-10-CM | POA: Diagnosis not present

## 2022-07-29 DIAGNOSIS — M7021 Olecranon bursitis, right elbow: Secondary | ICD-10-CM | POA: Diagnosis not present

## 2022-07-29 DIAGNOSIS — E1129 Type 2 diabetes mellitus with other diabetic kidney complication: Secondary | ICD-10-CM | POA: Diagnosis not present

## 2022-07-29 DIAGNOSIS — N400 Enlarged prostate without lower urinary tract symptoms: Secondary | ICD-10-CM | POA: Diagnosis not present

## 2022-07-29 DIAGNOSIS — M48061 Spinal stenosis, lumbar region without neurogenic claudication: Secondary | ICD-10-CM | POA: Diagnosis not present

## 2022-07-29 DIAGNOSIS — N1831 Chronic kidney disease, stage 3a: Secondary | ICD-10-CM | POA: Diagnosis not present

## 2022-07-29 DIAGNOSIS — T50905A Adverse effect of unspecified drugs, medicaments and biological substances, initial encounter: Secondary | ICD-10-CM | POA: Diagnosis not present

## 2022-07-29 DIAGNOSIS — Z6826 Body mass index (BMI) 26.0-26.9, adult: Secondary | ICD-10-CM | POA: Diagnosis not present

## 2022-07-29 DIAGNOSIS — E1122 Type 2 diabetes mellitus with diabetic chronic kidney disease: Secondary | ICD-10-CM | POA: Diagnosis not present

## 2022-08-25 DIAGNOSIS — H7292 Unspecified perforation of tympanic membrane, left ear: Secondary | ICD-10-CM | POA: Diagnosis not present

## 2022-08-25 DIAGNOSIS — H6993 Unspecified Eustachian tube disorder, bilateral: Secondary | ICD-10-CM | POA: Diagnosis not present

## 2022-08-25 DIAGNOSIS — H90A32 Mixed conductive and sensorineural hearing loss, unilateral, left ear with restricted hearing on the contralateral side: Secondary | ICD-10-CM | POA: Diagnosis not present

## 2022-08-25 DIAGNOSIS — Z9889 Other specified postprocedural states: Secondary | ICD-10-CM | POA: Diagnosis not present

## 2022-08-25 DIAGNOSIS — H95192 Other disorders following mastoidectomy, left ear: Secondary | ICD-10-CM | POA: Diagnosis not present

## 2022-08-25 DIAGNOSIS — H9312 Tinnitus, left ear: Secondary | ICD-10-CM | POA: Diagnosis not present

## 2022-08-25 DIAGNOSIS — Z9622 Myringotomy tube(s) status: Secondary | ICD-10-CM | POA: Diagnosis not present

## 2022-08-25 DIAGNOSIS — Z9089 Acquired absence of other organs: Secondary | ICD-10-CM | POA: Diagnosis not present

## 2022-08-31 DIAGNOSIS — N400 Enlarged prostate without lower urinary tract symptoms: Secondary | ICD-10-CM | POA: Diagnosis not present

## 2022-08-31 DIAGNOSIS — E1122 Type 2 diabetes mellitus with diabetic chronic kidney disease: Secondary | ICD-10-CM | POA: Diagnosis not present

## 2022-08-31 DIAGNOSIS — M48061 Spinal stenosis, lumbar region without neurogenic claudication: Secondary | ICD-10-CM | POA: Diagnosis not present

## 2022-08-31 DIAGNOSIS — G894 Chronic pain syndrome: Secondary | ICD-10-CM | POA: Diagnosis not present

## 2022-09-25 DIAGNOSIS — N401 Enlarged prostate with lower urinary tract symptoms: Secondary | ICD-10-CM | POA: Diagnosis not present

## 2022-09-25 DIAGNOSIS — G894 Chronic pain syndrome: Secondary | ICD-10-CM | POA: Diagnosis not present

## 2022-09-25 DIAGNOSIS — E782 Mixed hyperlipidemia: Secondary | ICD-10-CM | POA: Diagnosis not present

## 2022-09-28 DIAGNOSIS — G894 Chronic pain syndrome: Secondary | ICD-10-CM | POA: Diagnosis not present

## 2022-09-28 DIAGNOSIS — E1122 Type 2 diabetes mellitus with diabetic chronic kidney disease: Secondary | ICD-10-CM | POA: Diagnosis not present

## 2022-09-28 DIAGNOSIS — N183 Chronic kidney disease, stage 3 unspecified: Secondary | ICD-10-CM | POA: Diagnosis not present

## 2022-09-28 DIAGNOSIS — M48061 Spinal stenosis, lumbar region without neurogenic claudication: Secondary | ICD-10-CM | POA: Diagnosis not present

## 2022-10-10 DIAGNOSIS — Z961 Presence of intraocular lens: Secondary | ICD-10-CM | POA: Diagnosis not present

## 2022-10-10 DIAGNOSIS — D23121 Other benign neoplasm of skin of left upper eyelid, including canthus: Secondary | ICD-10-CM | POA: Diagnosis not present

## 2022-10-10 DIAGNOSIS — H11153 Pinguecula, bilateral: Secondary | ICD-10-CM | POA: Diagnosis not present

## 2022-10-10 DIAGNOSIS — H02825 Cysts of left lower eyelid: Secondary | ICD-10-CM | POA: Diagnosis not present

## 2022-10-31 DIAGNOSIS — N400 Enlarged prostate without lower urinary tract symptoms: Secondary | ICD-10-CM | POA: Diagnosis not present

## 2022-10-31 DIAGNOSIS — E1122 Type 2 diabetes mellitus with diabetic chronic kidney disease: Secondary | ICD-10-CM | POA: Diagnosis not present

## 2022-10-31 DIAGNOSIS — E1129 Type 2 diabetes mellitus with other diabetic kidney complication: Secondary | ICD-10-CM | POA: Diagnosis not present

## 2022-10-31 DIAGNOSIS — E663 Overweight: Secondary | ICD-10-CM | POA: Diagnosis not present

## 2022-10-31 DIAGNOSIS — Z6825 Body mass index (BMI) 25.0-25.9, adult: Secondary | ICD-10-CM | POA: Diagnosis not present

## 2022-10-31 DIAGNOSIS — M48061 Spinal stenosis, lumbar region without neurogenic claudication: Secondary | ICD-10-CM | POA: Diagnosis not present

## 2022-10-31 DIAGNOSIS — N183 Chronic kidney disease, stage 3 unspecified: Secondary | ICD-10-CM | POA: Diagnosis not present

## 2022-10-31 DIAGNOSIS — G894 Chronic pain syndrome: Secondary | ICD-10-CM | POA: Diagnosis not present

## 2022-12-19 DIAGNOSIS — M5416 Radiculopathy, lumbar region: Secondary | ICD-10-CM | POA: Diagnosis not present

## 2022-12-19 DIAGNOSIS — N1831 Chronic kidney disease, stage 3a: Secondary | ICD-10-CM | POA: Diagnosis not present

## 2022-12-19 DIAGNOSIS — Z6825 Body mass index (BMI) 25.0-25.9, adult: Secondary | ICD-10-CM | POA: Diagnosis not present

## 2022-12-19 DIAGNOSIS — E663 Overweight: Secondary | ICD-10-CM | POA: Diagnosis not present

## 2022-12-19 DIAGNOSIS — G894 Chronic pain syndrome: Secondary | ICD-10-CM | POA: Diagnosis not present

## 2022-12-19 DIAGNOSIS — Z0001 Encounter for general adult medical examination with abnormal findings: Secondary | ICD-10-CM | POA: Diagnosis not present

## 2022-12-19 DIAGNOSIS — Z1331 Encounter for screening for depression: Secondary | ICD-10-CM | POA: Diagnosis not present

## 2022-12-19 DIAGNOSIS — I700 Atherosclerosis of aorta: Secondary | ICD-10-CM | POA: Diagnosis not present

## 2022-12-19 DIAGNOSIS — N400 Enlarged prostate without lower urinary tract symptoms: Secondary | ICD-10-CM | POA: Diagnosis not present

## 2022-12-19 DIAGNOSIS — E782 Mixed hyperlipidemia: Secondary | ICD-10-CM | POA: Diagnosis not present

## 2022-12-19 DIAGNOSIS — M48061 Spinal stenosis, lumbar region without neurogenic claudication: Secondary | ICD-10-CM | POA: Diagnosis not present

## 2023-01-18 DIAGNOSIS — G894 Chronic pain syndrome: Secondary | ICD-10-CM | POA: Diagnosis not present

## 2023-01-18 DIAGNOSIS — N183 Chronic kidney disease, stage 3 unspecified: Secondary | ICD-10-CM | POA: Diagnosis not present

## 2023-01-18 DIAGNOSIS — N400 Enlarged prostate without lower urinary tract symptoms: Secondary | ICD-10-CM | POA: Diagnosis not present

## 2023-01-18 DIAGNOSIS — M48061 Spinal stenosis, lumbar region without neurogenic claudication: Secondary | ICD-10-CM | POA: Diagnosis not present

## 2023-01-26 DIAGNOSIS — E782 Mixed hyperlipidemia: Secondary | ICD-10-CM | POA: Diagnosis not present

## 2023-01-26 DIAGNOSIS — M48061 Spinal stenosis, lumbar region without neurogenic claudication: Secondary | ICD-10-CM | POA: Diagnosis not present

## 2023-01-26 DIAGNOSIS — G894 Chronic pain syndrome: Secondary | ICD-10-CM | POA: Diagnosis not present

## 2023-01-26 DIAGNOSIS — N183 Chronic kidney disease, stage 3 unspecified: Secondary | ICD-10-CM | POA: Diagnosis not present

## 2023-02-17 DIAGNOSIS — N183 Chronic kidney disease, stage 3 unspecified: Secondary | ICD-10-CM | POA: Diagnosis not present

## 2023-02-17 DIAGNOSIS — G894 Chronic pain syndrome: Secondary | ICD-10-CM | POA: Diagnosis not present

## 2023-02-17 DIAGNOSIS — M48061 Spinal stenosis, lumbar region without neurogenic claudication: Secondary | ICD-10-CM | POA: Diagnosis not present

## 2023-02-17 DIAGNOSIS — M1991 Primary osteoarthritis, unspecified site: Secondary | ICD-10-CM | POA: Diagnosis not present

## 2023-03-03 DIAGNOSIS — H7192 Unspecified cholesteatoma, left ear: Secondary | ICD-10-CM | POA: Diagnosis not present

## 2023-03-03 DIAGNOSIS — H6993 Unspecified Eustachian tube disorder, bilateral: Secondary | ICD-10-CM | POA: Diagnosis not present

## 2023-03-03 DIAGNOSIS — Z9089 Acquired absence of other organs: Secondary | ICD-10-CM | POA: Diagnosis not present

## 2023-03-03 DIAGNOSIS — H90A32 Mixed conductive and sensorineural hearing loss, unilateral, left ear with restricted hearing on the contralateral side: Secondary | ICD-10-CM | POA: Diagnosis not present

## 2023-03-03 DIAGNOSIS — Z9889 Other specified postprocedural states: Secondary | ICD-10-CM | POA: Diagnosis not present

## 2023-03-03 DIAGNOSIS — H9312 Tinnitus, left ear: Secondary | ICD-10-CM | POA: Diagnosis not present

## 2023-03-03 DIAGNOSIS — H7012 Chronic mastoiditis, left ear: Secondary | ICD-10-CM | POA: Diagnosis not present

## 2023-03-03 DIAGNOSIS — Z4589 Encounter for adjustment and management of other implanted devices: Secondary | ICD-10-CM | POA: Diagnosis not present

## 2023-04-05 DIAGNOSIS — G894 Chronic pain syndrome: Secondary | ICD-10-CM | POA: Diagnosis not present

## 2023-04-05 DIAGNOSIS — E7849 Other hyperlipidemia: Secondary | ICD-10-CM | POA: Diagnosis not present

## 2023-04-05 DIAGNOSIS — E663 Overweight: Secondary | ICD-10-CM | POA: Diagnosis not present

## 2023-04-05 DIAGNOSIS — I700 Atherosclerosis of aorta: Secondary | ICD-10-CM | POA: Diagnosis not present

## 2023-04-05 DIAGNOSIS — N1831 Chronic kidney disease, stage 3a: Secondary | ICD-10-CM | POA: Diagnosis not present

## 2023-04-05 DIAGNOSIS — M48061 Spinal stenosis, lumbar region without neurogenic claudication: Secondary | ICD-10-CM | POA: Diagnosis not present

## 2023-04-05 DIAGNOSIS — N400 Enlarged prostate without lower urinary tract symptoms: Secondary | ICD-10-CM | POA: Diagnosis not present

## 2023-04-05 DIAGNOSIS — M5416 Radiculopathy, lumbar region: Secondary | ICD-10-CM | POA: Diagnosis not present

## 2023-04-05 DIAGNOSIS — Z6826 Body mass index (BMI) 26.0-26.9, adult: Secondary | ICD-10-CM | POA: Diagnosis not present

## 2023-04-06 DIAGNOSIS — Z4589 Encounter for adjustment and management of other implanted devices: Secondary | ICD-10-CM | POA: Diagnosis not present

## 2023-04-06 DIAGNOSIS — H6993 Unspecified Eustachian tube disorder, bilateral: Secondary | ICD-10-CM | POA: Diagnosis not present

## 2023-04-06 DIAGNOSIS — Z9889 Other specified postprocedural states: Secondary | ICD-10-CM | POA: Diagnosis not present

## 2023-04-06 DIAGNOSIS — H7292 Unspecified perforation of tympanic membrane, left ear: Secondary | ICD-10-CM | POA: Diagnosis not present

## 2023-04-06 DIAGNOSIS — H90A32 Mixed conductive and sensorineural hearing loss, unilateral, left ear with restricted hearing on the contralateral side: Secondary | ICD-10-CM | POA: Diagnosis not present

## 2023-04-06 DIAGNOSIS — Z9089 Acquired absence of other organs: Secondary | ICD-10-CM | POA: Diagnosis not present

## 2023-04-06 DIAGNOSIS — H9312 Tinnitus, left ear: Secondary | ICD-10-CM | POA: Diagnosis not present

## 2023-05-04 DIAGNOSIS — N183 Chronic kidney disease, stage 3 unspecified: Secondary | ICD-10-CM | POA: Diagnosis not present

## 2023-05-04 DIAGNOSIS — N400 Enlarged prostate without lower urinary tract symptoms: Secondary | ICD-10-CM | POA: Diagnosis not present

## 2023-05-04 DIAGNOSIS — N1831 Chronic kidney disease, stage 3a: Secondary | ICD-10-CM | POA: Diagnosis not present

## 2023-05-04 DIAGNOSIS — M48061 Spinal stenosis, lumbar region without neurogenic claudication: Secondary | ICD-10-CM | POA: Diagnosis not present

## 2023-05-04 DIAGNOSIS — G894 Chronic pain syndrome: Secondary | ICD-10-CM | POA: Diagnosis not present

## 2023-06-01 DIAGNOSIS — N1831 Chronic kidney disease, stage 3a: Secondary | ICD-10-CM | POA: Diagnosis not present

## 2023-06-01 DIAGNOSIS — I700 Atherosclerosis of aorta: Secondary | ICD-10-CM | POA: Diagnosis not present

## 2023-06-01 DIAGNOSIS — M48061 Spinal stenosis, lumbar region without neurogenic claudication: Secondary | ICD-10-CM | POA: Diagnosis not present

## 2023-06-01 DIAGNOSIS — G894 Chronic pain syndrome: Secondary | ICD-10-CM | POA: Diagnosis not present

## 2023-06-01 DIAGNOSIS — N400 Enlarged prostate without lower urinary tract symptoms: Secondary | ICD-10-CM | POA: Diagnosis not present

## 2023-06-01 DIAGNOSIS — E782 Mixed hyperlipidemia: Secondary | ICD-10-CM | POA: Diagnosis not present

## 2023-06-01 DIAGNOSIS — M5416 Radiculopathy, lumbar region: Secondary | ICD-10-CM | POA: Diagnosis not present

## 2023-06-30 DIAGNOSIS — N400 Enlarged prostate without lower urinary tract symptoms: Secondary | ICD-10-CM | POA: Diagnosis not present

## 2023-06-30 DIAGNOSIS — N183 Chronic kidney disease, stage 3 unspecified: Secondary | ICD-10-CM | POA: Diagnosis not present

## 2023-06-30 DIAGNOSIS — M5416 Radiculopathy, lumbar region: Secondary | ICD-10-CM | POA: Diagnosis not present

## 2023-06-30 DIAGNOSIS — I700 Atherosclerosis of aorta: Secondary | ICD-10-CM | POA: Diagnosis not present

## 2023-06-30 DIAGNOSIS — E663 Overweight: Secondary | ICD-10-CM | POA: Diagnosis not present

## 2023-06-30 DIAGNOSIS — Z6827 Body mass index (BMI) 27.0-27.9, adult: Secondary | ICD-10-CM | POA: Diagnosis not present

## 2023-06-30 DIAGNOSIS — M48061 Spinal stenosis, lumbar region without neurogenic claudication: Secondary | ICD-10-CM | POA: Diagnosis not present

## 2023-06-30 DIAGNOSIS — E782 Mixed hyperlipidemia: Secondary | ICD-10-CM | POA: Diagnosis not present

## 2023-06-30 DIAGNOSIS — G894 Chronic pain syndrome: Secondary | ICD-10-CM | POA: Diagnosis not present

## 2023-06-30 DIAGNOSIS — N1831 Chronic kidney disease, stage 3a: Secondary | ICD-10-CM | POA: Diagnosis not present

## 2023-07-28 DIAGNOSIS — G894 Chronic pain syndrome: Secondary | ICD-10-CM | POA: Diagnosis not present

## 2023-07-28 DIAGNOSIS — I700 Atherosclerosis of aorta: Secondary | ICD-10-CM | POA: Diagnosis not present

## 2023-07-28 DIAGNOSIS — N400 Enlarged prostate without lower urinary tract symptoms: Secondary | ICD-10-CM | POA: Diagnosis not present

## 2023-07-28 DIAGNOSIS — N183 Chronic kidney disease, stage 3 unspecified: Secondary | ICD-10-CM | POA: Diagnosis not present

## 2023-07-28 DIAGNOSIS — J309 Allergic rhinitis, unspecified: Secondary | ICD-10-CM | POA: Diagnosis not present

## 2023-07-28 DIAGNOSIS — N1831 Chronic kidney disease, stage 3a: Secondary | ICD-10-CM | POA: Diagnosis not present

## 2023-07-28 DIAGNOSIS — M48061 Spinal stenosis, lumbar region without neurogenic claudication: Secondary | ICD-10-CM | POA: Diagnosis not present

## 2023-08-22 ENCOUNTER — Ambulatory Visit
Admission: RE | Admit: 2023-08-22 | Discharge: 2023-08-22 | Disposition: A | Discharge status: Home / Self Care | Payer: Self-pay | Source: Ambulatory Visit | Attending: Family Medicine | Admitting: Family Medicine

## 2023-08-22 VITALS — BP 169/72 | HR 91 | Temp 98.2°F | Resp 14

## 2023-08-22 DIAGNOSIS — Z9889 Other specified postprocedural states: Secondary | ICD-10-CM | POA: Diagnosis not present

## 2023-08-22 DIAGNOSIS — H66002 Acute suppurative otitis media without spontaneous rupture of ear drum, left ear: Secondary | ICD-10-CM | POA: Diagnosis not present

## 2023-08-22 MED ORDER — CIPROFLOXACIN-DEXAMETHASONE 0.3-0.1 % OT SUSP: 4.0000 [drp] | Freq: Two times a day (BID) | OTIC | 0 refills | Status: AC

## 2023-08-22 NOTE — ED Provider Notes (Signed)
 [facility]    CSN: [id number] Arrival date & time: 08/22/23  1052      History   Chief Complaint Chief Complaint  Patient presents with   Ear Drainage    Entered by patient    HPI Douglas Waller is a 78 y.o. male.   Patient presenting today with yellow ear drainage, fullness, pressure for the past 2 weeks.  He denies fever, chills, congestion, headache, bloody drainage from ear.  History of cochlear implant and tympanostomy tubes in this ear, and per chart review has had TobraDex drops in the past for this issue.  So far not tried anything over-the-counter for symptoms.    Past Medical History:  Diagnosis Date   Back pain     There are no active problems to display for this patient.   Past Surgical History:  Procedure Laterality Date   BACK SURGERY     boston simulator in back     CATARACT EXTRACTION     COLONOSCOPY N/A 03/08/2016   Procedure: COLONOSCOPY;  Surgeon: Alanda Allegra, MD;  [facility]: [facility];  Service: Gastroenterology;  Laterality: N/A;   EAR MASTOIDECTOMY W/ COCHLEAR IMPLANT W/ LANDMARK Left    INNER EAR SURGERY         Home Medications    Prior to Admission medications   Medication Sig Start Date End Date Taking? Authorizing Provider  ciprofloxacin -dexamethasone  (CIPRODEX ) OTIC suspension Place 4 drops into the left ear 2 (two) times daily. 08/22/23  Yes Corbin Dess, PA-C  albuterol  (VENTOLIN  HFA) 108 (90 Base) MCG/ACT inhaler Inhale 2 puffs into the lungs every 4 (four) hours as needed for wheezing or shortness of breath. 04/16/22   Avegno, Komlanvi S, FNP  amoxicillin -clavulanate (AUGMENTIN ) 875-125 MG tablet Take 1 tablet by mouth every 12 (twelve) hours. 04/16/22   Avegno, Komlanvi S, FNP  benzonatate  (TESSALON ) 100 MG capsule Take 1 capsule (100 mg total) by mouth 3 (three) times daily as needed for cough. 04/16/22   Avegno, Komlanvi S, FNP  furosemide (LASIX) 20 MG tablet Take 20 mg by mouth.    [provider]  morphine (MSIR) 15 MG tablet Take 15 mg by mouth every 4 (four) hours as needed for severe pain.    [provider]  oxycodone  (ROXICODONE ) 30 MG immediate release tablet Take 30 mg by mouth every 4 (four) hours as needed for pain. 01/25/16   [provider]  Tamsulosin HCl (FLOMAX) 0.4 MG CAPS Take 0.4 mg by mouth daily after supper.    [provider]    Family History Family History  Problem Relation Age of Onset   Cancer Mother    Heart disease Father     Social History Social History   Tobacco Use   Smoking status: Every Day    Current packs/day: 1.50    Average packs/day: 1.5 packs/day for 57.0 years (85.5 ttl pk-yrs)    Types: Cigarettes   Smokeless tobacco: Never  Substance Use Topics   Alcohol use: No   Drug use: No     Allergies   Patient has no known allergies.   Review of Systems Review of Systems Per HPI  Physical Exam Triage Vital Signs ED Triage Vitals [08/22/23 1122]  Encounter Vitals Group     BP (!) 169/72     Systolic BP Percentile      Diastolic BP Percentile      Pulse Rate 91     Resp 14  Temp 98.2 F (36.8 C)     Temp Source Oral     SpO2 93 %     Weight      Height      Head Circumference      Peak Flow      Pain Score 0     Pain Loc      Pain Education      Exclude from Growth Chart    No data found.  Updated Vital Signs BP (!) 169/72 (BP Location: Right Arm)   Pulse 91   Temp 98.2 F (36.8 C) (Oral)   Resp 14   SpO2 93%   Visual Acuity Right Eye Distance:   Left Eye Distance:   Bilateral Distance:    Right Eye Near:   Left Eye Near:    Bilateral Near:     Physical Exam Vitals and nursing note reviewed.  Constitutional:      Appearance: Normal appearance.  HENT:     Head: Atraumatic.     Right Ear: Tympanic membrane normal.     Ears:     Comments: Left TM not erythematous, no purulent drainage present, slight injection and erythema to the lateral edge.  No appreciable active  drainage    Nose: Nose normal.     Mouth/Throat:     Mouth: Mucous membranes are moist.  Eyes:     Extraocular Movements: Extraocular movements intact.     Conjunctiva/sclera: Conjunctivae normal.  Cardiovascular:     Rate and Rhythm: Normal rate.  Pulmonary:     Effort: Pulmonary effort is normal.  Musculoskeletal:        General: Normal range of motion.     Cervical back: Normal range of motion and neck supple.  Skin:    General: Skin is warm and dry.  Neurological:     General: No focal deficit present.     Mental Status: He is oriented to person, place, and time.  Psychiatric:        Mood and Affect: Mood normal.        Thought Content: Thought content normal.        Judgment: Judgment normal.      UC Treatments / Results  Labs (all labs ordered are listed, but only abnormal results are displayed) Labs Reviewed - No data to display  EKG   Radiology No results found.  Procedures Procedures (including critical care time)  Medications Ordered in UC Medications - No data to display  Initial Impression / Assessment and Plan / UC Course  I have reviewed the triage vital signs and the nursing notes.  Pertinent labs & imaging results that were available during my care of the patient were reviewed by me and considered in my medical decision making (see chart for details).     Will treat with Ciprodex drops, allergy retrograde, otolaryngology follow-up.  Return for worsening symptoms. Final Clinical Impressions(s) / UC Diagnoses   Final diagnoses:  Acute suppurative otitis media of left ear without spontaneous rupture of tympanic membrane, recurrence not specified  Hx of tympanostomy tubes   Discharge Instructions   None    ED Prescriptions     Medication Sig Dispense Auth. Provider   ciprofloxacin-dexamethasone (CIPRODEX) OTIC suspension Place 4 drops into the left ear 2 (two) times daily. 7.5 mL Corbin Dess, [place]      PDMP not reviewed this  encounter.   Corbin Dess, PA-C 08/22/23 1159

## 2023-08-22 NOTE — ED Triage Notes (Signed)
 Pt reports left ear drainage, pt hs a cochlear implant in the left ear x  2  weeks

## 2023-08-30 DIAGNOSIS — N1831 Chronic kidney disease, stage 3a: Secondary | ICD-10-CM | POA: Diagnosis not present

## 2023-08-30 DIAGNOSIS — M48061 Spinal stenosis, lumbar region without neurogenic claudication: Secondary | ICD-10-CM | POA: Diagnosis not present

## 2023-08-30 DIAGNOSIS — M5416 Radiculopathy, lumbar region: Secondary | ICD-10-CM | POA: Diagnosis not present

## 2023-08-30 DIAGNOSIS — G894 Chronic pain syndrome: Secondary | ICD-10-CM | POA: Diagnosis not present

## 2023-09-28 DIAGNOSIS — N183 Chronic kidney disease, stage 3 unspecified: Secondary | ICD-10-CM | POA: Diagnosis not present

## 2023-09-28 DIAGNOSIS — M1991 Primary osteoarthritis, unspecified site: Secondary | ICD-10-CM | POA: Diagnosis not present

## 2023-09-28 DIAGNOSIS — G894 Chronic pain syndrome: Secondary | ICD-10-CM | POA: Diagnosis not present

## 2023-09-28 DIAGNOSIS — N400 Enlarged prostate without lower urinary tract symptoms: Secondary | ICD-10-CM | POA: Diagnosis not present

## 2023-09-28 DIAGNOSIS — N1831 Chronic kidney disease, stage 3a: Secondary | ICD-10-CM | POA: Diagnosis not present

## 2023-09-28 DIAGNOSIS — M48061 Spinal stenosis, lumbar region without neurogenic claudication: Secondary | ICD-10-CM | POA: Diagnosis not present

## 2023-09-28 DIAGNOSIS — E663 Overweight: Secondary | ICD-10-CM | POA: Diagnosis not present

## 2023-09-28 DIAGNOSIS — Z6826 Body mass index (BMI) 26.0-26.9, adult: Secondary | ICD-10-CM | POA: Diagnosis not present

## 2023-10-05 DIAGNOSIS — H6993 Unspecified Eustachian tube disorder, bilateral: Secondary | ICD-10-CM | POA: Diagnosis not present

## 2023-10-05 DIAGNOSIS — H90A32 Mixed conductive and sensorineural hearing loss, unilateral, left ear with restricted hearing on the contralateral side: Secondary | ICD-10-CM | POA: Diagnosis not present

## 2023-10-05 DIAGNOSIS — Z4589 Encounter for adjustment and management of other implanted devices: Secondary | ICD-10-CM | POA: Diagnosis not present

## 2023-10-05 DIAGNOSIS — H7292 Unspecified perforation of tympanic membrane, left ear: Secondary | ICD-10-CM | POA: Diagnosis not present

## 2023-10-05 DIAGNOSIS — Z9089 Acquired absence of other organs: Secondary | ICD-10-CM | POA: Diagnosis not present

## 2023-10-05 DIAGNOSIS — Z9889 Other specified postprocedural states: Secondary | ICD-10-CM | POA: Diagnosis not present

## 2023-10-05 DIAGNOSIS — H9312 Tinnitus, left ear: Secondary | ICD-10-CM | POA: Diagnosis not present

## 2023-10-11 DIAGNOSIS — H11153 Pinguecula, bilateral: Secondary | ICD-10-CM | POA: Diagnosis not present

## 2023-10-11 DIAGNOSIS — H02825 Cysts of left lower eyelid: Secondary | ICD-10-CM | POA: Diagnosis not present

## 2023-10-11 DIAGNOSIS — Z961 Presence of intraocular lens: Secondary | ICD-10-CM | POA: Diagnosis not present

## 2023-10-11 DIAGNOSIS — D23121 Other benign neoplasm of skin of left upper eyelid, including canthus: Secondary | ICD-10-CM | POA: Diagnosis not present

## 2023-10-25 ENCOUNTER — Encounter: Payer: Self-pay | Admitting: Physician Assistant

## 2023-10-25 ENCOUNTER — Ambulatory Visit: Admitting: Physician Assistant

## 2023-10-25 VITALS — BP 140/62 | HR 68 | Temp 98.1°F | Ht 71.0 in | Wt 178.4 lb

## 2023-10-25 DIAGNOSIS — G894 Chronic pain syndrome: Secondary | ICD-10-CM

## 2023-10-25 DIAGNOSIS — Z7689 Persons encountering health services in other specified circumstances: Secondary | ICD-10-CM

## 2023-10-25 DIAGNOSIS — H6993 Unspecified Eustachian tube disorder, bilateral: Secondary | ICD-10-CM | POA: Insufficient documentation

## 2023-10-25 DIAGNOSIS — H90A32 Mixed conductive and sensorineural hearing loss, unilateral, left ear with restricted hearing on the contralateral side: Secondary | ICD-10-CM | POA: Insufficient documentation

## 2023-10-25 NOTE — Progress Notes (Signed)
 New Patient Office Visit  Subjective    Patient ID: Douglas Waller, male    DOB: 07/02/1945  Age: 78 y.o. MRN: [id number]  CC: No chief complaint on file.   HPI  Patient presents today with his daughter to establish care. Patient with past medical history significant for chronic low back pain and s/p cochlear implant. Daughter reports patient has a spine stimulator, however this offers little relief for his back pain. Patient currently on high doses of morphine and oxycodone  for pain management via previous PCP. Patient has no other concerns or complaints from establishing care. Obtaining records from previous PCP.   Outpatient Encounter Medications as of 10/25/2023  Medication Sig   ciprofloxacin -dexamethasone  (CIPRODEX ) OTIC suspension Place 4 drops into the left ear 2 (two) times daily.   morphine (MSIR) 15 MG tablet Take 15 mg by mouth every 4 (four) hours as needed for severe pain.   oxycodone  (ROXICODONE ) 30 MG immediate release tablet Take 30 mg by mouth every 4 (four) hours as needed for pain.   Tamsulosin HCl (FLOMAX) 0.4 MG CAPS Take 0.4 mg by mouth daily after supper.   [DISCONTINUED] benzonatate  (TESSALON ) 100 MG capsule Take 1 capsule (100 mg total) by mouth 3 (three) times daily as needed for cough.   [DISCONTINUED] furosemide (LASIX) 20 MG tablet Take 20 mg by mouth.   [DISCONTINUED] albuterol  (VENTOLIN  HFA) 108 (90 Base) MCG/ACT inhaler Inhale 2 puffs into the lungs every 4 (four) hours as needed for wheezing or shortness of breath. (Patient not taking: Reported on 10/25/2023)   [DISCONTINUED] amoxicillin -clavulanate (AUGMENTIN ) 875-125 MG tablet Take 1 tablet by mouth every 12 (twelve) hours. (Patient not taking: Reported on 10/25/2023)   No facility-administered encounter medications on file as of 10/25/2023.    Past Medical History:  Diagnosis Date   Back pain     Past Surgical History:  Procedure Laterality Date   BACK SURGERY     boston simulator in back      CATARACT EXTRACTION     COLONOSCOPY N/A 03/08/2016   Procedure: COLONOSCOPY;  Surgeon: Oneil Budge, MD;  Location: [facility];  Service: Gastroenterology;  Laterality: N/A;   EAR MASTOIDECTOMY W/ COCHLEAR IMPLANT W/ LANDMARK Left    INNER EAR SURGERY      Family History  Problem Relation Age of Onset   Cancer Mother    Heart disease Father     Social History   Socioeconomic History   Marital status: Single    Spouse name: Not on file   Number of children: Not on file   Years of education: Not on file   Highest education level: Not on file  Occupational History   Not on file  Tobacco Use   Smoking status: Every Day    Current packs/day: 1.50    Average packs/day: 1.5 packs/day for 57.0 years (85.5 ttl pk-yrs)    Types: Cigarettes   Smokeless tobacco: Never  Substance and Sexual Activity   Alcohol use: No   Drug use: No   Sexual activity: Not on file  Other Topics Concern   Not on file  Social History Narrative   Not on file   Social Drivers of Health   Financial Resource Strain: Not on file  Food Insecurity: Low Risk  (10/05/2023)   Received from [facility]   Hunger Vital Sign    Within the past 12 months, you worried that your food would run out before you got money to buy more: Never true  Within the past 12 months, the food you bought just didn't last and you didn't have money to get more. : Never true  Transportation Needs: No Transportation Needs (10/05/2023)   Received from [facility]    In the past 12 months, has lack of reliable transportation kept you from medical appointments, meetings, work or from getting things needed for daily living? : No  Physical Activity: Not on file  Stress: Not on file  Social Connections: Unknown (08/08/2021)   [facility]   Social Network    Social Network: Not on file  Intimate Partner Violence: Unknown (06/28/2021)   Received from [facility]   HITS    Physically Hurt: Not on  file    Insult or Talk Down To: Not on file    Threaten Physical Harm: Not on file    Scream or Curse: Not on file    Review of Systems  Constitutional:  Negative for chills, fever and malaise/fatigue.  Respiratory:  Negative for cough and shortness of breath.   Cardiovascular:  Negative for chest pain and palpitations.  Musculoskeletal:  Positive for back pain and joint pain. Negative for myalgias.  Neurological:  Negative for dizziness and headaches.        Objective    BP (!) 140/62   Pulse 68   Temp 98.1 F (36.7 C)   Ht 5' 11 (1.803 m)   Wt 178 lb 6.4 oz (80.9 kg)   SpO2 96%   BMI 24.88 kg/m   Physical Exam Constitutional:      Appearance: Normal appearance.  HENT:     Head: Normocephalic.     Mouth/Throat:     Mouth: Mucous membranes are moist.     Pharynx: Oropharynx is clear.  Eyes:     Extraocular Movements: Extraocular movements intact.     Conjunctiva/sclera: Conjunctivae normal.     Comments: Pinpoint pupils bilaterally   Cardiovascular:     Rate and Rhythm: Normal rate and regular rhythm.     Heart sounds: Normal heart sounds. No murmur heard. Pulmonary:     Effort: Pulmonary effort is normal.     Breath sounds: No wheezing or rales.  Skin:    General: Skin is warm and dry.  Neurological:     General: No focal deficit present.     Cranial Nerves: No cranial nerve deficit or facial asymmetry.     Motor: No weakness.     Gait: Gait normal.  Psychiatric:        Mood and Affect: Mood normal.        Behavior: Behavior normal.       Assessment & Plan:  Encounter to establish care  Chronic pain syndrome Assessment & Plan: Patient presents today with lengthy history of chronic pain. Patient currently on high dose pain medication. Discussed concerns regarding medication with patient and daughter. Referral placed to pain management.   Orders: -     Ambulatory referral to Pain Clinic    Return in about 6 months (around 04/26/2024).    Charmaine Anish Vana, PA-C

## 2023-10-25 NOTE — Addendum Note (Signed)
 Addended by: MANCIL PFEIFFER on: 10/25/2023 12:44 PM   Modules accepted: Orders

## 2023-10-25 NOTE — Assessment & Plan Note (Signed)
 Patient presents today with lengthy history of chronic pain. Patient currently on high dose pain medication. Discussed concerns regarding medication with patient and daughter. Referral placed to pain management.

## 2023-10-30 DIAGNOSIS — N1831 Chronic kidney disease, stage 3a: Secondary | ICD-10-CM | POA: Diagnosis not present

## 2023-10-30 DIAGNOSIS — N400 Enlarged prostate without lower urinary tract symptoms: Secondary | ICD-10-CM | POA: Diagnosis not present

## 2023-10-30 DIAGNOSIS — G894 Chronic pain syndrome: Secondary | ICD-10-CM | POA: Diagnosis not present

## 2023-10-30 DIAGNOSIS — I700 Atherosclerosis of aorta: Secondary | ICD-10-CM | POA: Diagnosis not present

## 2023-10-30 DIAGNOSIS — M48061 Spinal stenosis, lumbar region without neurogenic claudication: Secondary | ICD-10-CM | POA: Diagnosis not present

## 2023-11-03 ENCOUNTER — Ambulatory Visit: Payer: Self-pay | Admitting: Family Medicine

## 2023-11-29 ENCOUNTER — Telehealth: Payer: Self-pay | Admitting: *Deleted

## 2023-11-29 NOTE — Telephone Encounter (Signed)
 Copied from CRM #[id number]. Topic: Referral - Status >> Nov 28, 2023  4:21 PM Delon DASEN wrote: Reason for CRM: Still waiting for appt with pain management- please call daughter Odella [phone]

## 2023-11-29 NOTE — Telephone Encounter (Signed)
 Telephone call no answer to notify patient  Daughter is not on DPR-  referral was sent to [facility] [facility] 11/07/23- Spoke with new patient coordinator Quintin and he stated the chart is still under review by the provider to see if they would accept hi, as a new patient. He said there is no time line he can give as to when a decision will be made and there is no guarantee they will accept him as a new patient. If they do accept hi, as a new patient they will reach out to the patient directly to schedule an appointment. The patient is welcome to contact the office directly for updates on status.

## 2023-11-30 ENCOUNTER — Other Ambulatory Visit: Payer: Self-pay | Admitting: Family Medicine

## 2023-11-30 ENCOUNTER — Telehealth: Payer: Self-pay | Admitting: Physician Assistant

## 2023-11-30 DIAGNOSIS — N1831 Chronic kidney disease, stage 3a: Secondary | ICD-10-CM | POA: Diagnosis not present

## 2023-11-30 DIAGNOSIS — M48061 Spinal stenosis, lumbar region without neurogenic claudication: Secondary | ICD-10-CM | POA: Diagnosis not present

## 2023-11-30 DIAGNOSIS — N183 Chronic kidney disease, stage 3 unspecified: Secondary | ICD-10-CM | POA: Diagnosis not present

## 2023-11-30 DIAGNOSIS — M5416 Radiculopathy, lumbar region: Secondary | ICD-10-CM | POA: Diagnosis not present

## 2023-11-30 DIAGNOSIS — I700 Atherosclerosis of aorta: Secondary | ICD-10-CM | POA: Diagnosis not present

## 2023-11-30 DIAGNOSIS — G894 Chronic pain syndrome: Secondary | ICD-10-CM | POA: Diagnosis not present

## 2023-11-30 DIAGNOSIS — N400 Enlarged prostate without lower urinary tract symptoms: Secondary | ICD-10-CM | POA: Diagnosis not present

## 2023-11-30 NOTE — Telephone Encounter (Unsigned)
 Copied from CRM [id number]. Topic: General - Other >> Nov 30, 2023 12:59 PM Donee H wrote: Reason for CRM: Patient's daughter Odella called requesting to speak to [place] directly. She states patient has a referral to pain management but will not be seen until Nov. Daughter is stating she needs something done regarding pain medication for father because he is not going to be able to wait to Nov. Requesting a callback at [phone]

## 2023-12-01 ENCOUNTER — Other Ambulatory Visit: Payer: Self-pay | Admitting: Family Medicine

## 2023-12-11 DIAGNOSIS — N400 Enlarged prostate without lower urinary tract symptoms: Secondary | ICD-10-CM | POA: Diagnosis not present

## 2023-12-11 DIAGNOSIS — M4807 Spinal stenosis, lumbosacral region: Secondary | ICD-10-CM | POA: Diagnosis not present

## 2023-12-11 DIAGNOSIS — N1831 Chronic kidney disease, stage 3a: Secondary | ICD-10-CM | POA: Diagnosis not present

## 2023-12-11 DIAGNOSIS — Z2821 Immunization not carried out because of patient refusal: Secondary | ICD-10-CM | POA: Diagnosis not present

## 2023-12-11 DIAGNOSIS — Z7689 Persons encountering health services in other specified circumstances: Secondary | ICD-10-CM | POA: Diagnosis not present

## 2023-12-11 DIAGNOSIS — F17210 Nicotine dependence, cigarettes, uncomplicated: Secondary | ICD-10-CM | POA: Diagnosis not present

## 2023-12-11 DIAGNOSIS — I10 Essential (primary) hypertension: Secondary | ICD-10-CM | POA: Diagnosis not present

## 2023-12-11 DIAGNOSIS — Z79899 Other long term (current) drug therapy: Secondary | ICD-10-CM | POA: Diagnosis not present

## 2024-01-03 ENCOUNTER — Telehealth: Payer: Self-pay | Admitting: Physician Assistant

## 2024-01-03 NOTE — Telephone Encounter (Signed)
 Spoke with daughter to try to schedule AWV- she explained to me that she would not be scheduling anything else with [facility] and to cancel anything that he has scheduled.  They were very unhappy with their experience, with providers and staff.   She asked that she be contacted by the office manage or someone in administration in reference to is complaints.   Thank you,  Corean,  AMB Clinical Support [facility] AWV Program Direct Dial ??[phone]

## 2024-01-08 ENCOUNTER — Ambulatory Visit: Payer: Self-pay

## 2024-01-08 DIAGNOSIS — J069 Acute upper respiratory infection, unspecified: Secondary | ICD-10-CM | POA: Diagnosis not present

## 2024-01-08 DIAGNOSIS — F17200 Nicotine dependence, unspecified, uncomplicated: Secondary | ICD-10-CM | POA: Diagnosis not present

## 2024-01-15 ENCOUNTER — Other Ambulatory Visit (HOSPITAL_COMMUNITY): Payer: Self-pay

## 2024-01-15 DIAGNOSIS — R053 Chronic cough: Secondary | ICD-10-CM

## 2024-01-16 ENCOUNTER — Ambulatory Visit (HOSPITAL_COMMUNITY)
Admission: RE | Admit: 2024-01-16 | Discharge: 2024-01-16 | Disposition: A | Discharge status: Home / Self Care | Source: Ambulatory Visit

## 2024-01-16 DIAGNOSIS — R0602 Shortness of breath: Secondary | ICD-10-CM | POA: Diagnosis not present

## 2024-01-16 DIAGNOSIS — R053 Chronic cough: Secondary | ICD-10-CM | POA: Insufficient documentation

## 2024-01-22 DIAGNOSIS — Z79899 Other long term (current) drug therapy: Secondary | ICD-10-CM | POA: Diagnosis not present

## 2024-01-22 DIAGNOSIS — Z79891 Long term (current) use of opiate analgesic: Secondary | ICD-10-CM | POA: Diagnosis not present

## 2024-01-22 DIAGNOSIS — M48061 Spinal stenosis, lumbar region without neurogenic claudication: Secondary | ICD-10-CM | POA: Diagnosis not present

## 2024-01-22 DIAGNOSIS — M545 Low back pain, unspecified: Secondary | ICD-10-CM | POA: Diagnosis not present

## 2024-01-22 DIAGNOSIS — G894 Chronic pain syndrome: Secondary | ICD-10-CM | POA: Diagnosis not present

## 2024-01-23 DIAGNOSIS — R7301 Impaired fasting glucose: Secondary | ICD-10-CM | POA: Diagnosis not present

## 2024-01-23 DIAGNOSIS — N1831 Chronic kidney disease, stage 3a: Secondary | ICD-10-CM | POA: Diagnosis not present

## 2024-01-23 DIAGNOSIS — Z6828 Body mass index (BMI) 28.0-28.9, adult: Secondary | ICD-10-CM | POA: Diagnosis not present

## 2024-01-23 DIAGNOSIS — I10 Essential (primary) hypertension: Secondary | ICD-10-CM | POA: Diagnosis not present

## 2024-01-29 DIAGNOSIS — N1831 Chronic kidney disease, stage 3a: Secondary | ICD-10-CM | POA: Diagnosis not present

## 2024-01-29 DIAGNOSIS — M4807 Spinal stenosis, lumbosacral region: Secondary | ICD-10-CM | POA: Diagnosis not present

## 2024-01-29 DIAGNOSIS — N400 Enlarged prostate without lower urinary tract symptoms: Secondary | ICD-10-CM | POA: Diagnosis not present

## 2024-01-29 DIAGNOSIS — F17200 Nicotine dependence, unspecified, uncomplicated: Secondary | ICD-10-CM | POA: Diagnosis not present

## 2024-01-29 DIAGNOSIS — I129 Hypertensive chronic kidney disease with stage 1 through stage 4 chronic kidney disease, or unspecified chronic kidney disease: Secondary | ICD-10-CM | POA: Diagnosis not present

## 2024-01-29 DIAGNOSIS — R7303 Prediabetes: Secondary | ICD-10-CM | POA: Diagnosis not present

## 2024-01-29 DIAGNOSIS — R053 Chronic cough: Secondary | ICD-10-CM | POA: Diagnosis not present

## 2024-01-29 DIAGNOSIS — Z6828 Body mass index (BMI) 28.0-28.9, adult: Secondary | ICD-10-CM | POA: Diagnosis not present

## 2024-02-19 DIAGNOSIS — Z79899 Other long term (current) drug therapy: Secondary | ICD-10-CM | POA: Diagnosis not present

## 2024-02-19 DIAGNOSIS — M545 Low back pain, unspecified: Secondary | ICD-10-CM | POA: Diagnosis not present

## 2024-02-19 DIAGNOSIS — M48061 Spinal stenosis, lumbar region without neurogenic claudication: Secondary | ICD-10-CM | POA: Diagnosis not present

## 2024-02-19 DIAGNOSIS — G894 Chronic pain syndrome: Secondary | ICD-10-CM | POA: Diagnosis not present

## 2024-04-26 ENCOUNTER — Ambulatory Visit: Admitting: Physician Assistant

## 2024-05-01 ENCOUNTER — Other Ambulatory Visit: Payer: Self-pay

## 2024-05-01 ENCOUNTER — Other Ambulatory Visit (HOSPITAL_BASED_OUTPATIENT_CLINIC_OR_DEPARTMENT_OTHER): Payer: Self-pay

## 2024-05-01 MED ORDER — JOURNAVX 50 MG PO TABS
ORAL_TABLET | ORAL | 0 refills | Status: AC
  Filled 2024-05-01: qty 30, 15d supply, fill #0

## 2024-05-01 MED ORDER — JOURNAVX 50 MG PO TABS
ORAL_TABLET | ORAL | 0 refills | Status: DC
  Filled 2024-05-01: qty 29, 15d supply, fill #0
